# Patient Record
Sex: Female | Born: 1950 | Race: White | Hispanic: No | Marital: Married | Smoking: Never smoker
Health system: Southern US, Community
[De-identification: ages and names within clinical notes are randomized; demographics above are authoritative.]

## PROBLEM LIST (undated history)

## (undated) DIAGNOSIS — I8390 Asymptomatic varicose veins of unspecified lower extremity: Secondary | ICD-10-CM

## (undated) DIAGNOSIS — C801 Malignant (primary) neoplasm, unspecified: Secondary | ICD-10-CM

## (undated) HISTORY — DX: Asymptomatic varicose veins of unspecified lower extremity: I83.90

## (undated) HISTORY — DX: Malignant (primary) neoplasm, unspecified: C80.1

---

## 1999-10-08 ENCOUNTER — Other Ambulatory Visit: Admission: RE | Admit: 1999-10-08 | Discharge: 1999-10-08 | Payer: Self-pay | Admitting: *Deleted

## 2000-02-20 ENCOUNTER — Ambulatory Visit (HOSPITAL_COMMUNITY): Admission: RE | Admit: 2000-02-20 | Discharge: 2000-02-20 | Payer: Self-pay | Admitting: *Deleted

## 2000-02-20 ENCOUNTER — Encounter: Payer: Self-pay | Admitting: *Deleted

## 2000-07-18 ENCOUNTER — Observation Stay (HOSPITAL_COMMUNITY): Admission: RE | Admit: 2000-07-18 | Discharge: 2000-07-19 | Payer: Self-pay | Admitting: Gastroenterology

## 2000-07-18 ENCOUNTER — Encounter (INDEPENDENT_AMBULATORY_CARE_PROVIDER_SITE_OTHER): Payer: Self-pay | Admitting: Specialist

## 2000-07-18 ENCOUNTER — Encounter: Payer: Self-pay | Admitting: Gastroenterology

## 2000-07-24 ENCOUNTER — Ambulatory Visit (HOSPITAL_COMMUNITY): Admission: RE | Admit: 2000-07-24 | Discharge: 2000-07-24 | Payer: Self-pay | Admitting: Surgery

## 2000-07-24 ENCOUNTER — Encounter: Payer: Self-pay | Admitting: Surgery

## 2000-08-06 ENCOUNTER — Encounter (INDEPENDENT_AMBULATORY_CARE_PROVIDER_SITE_OTHER): Payer: Self-pay | Admitting: Specialist

## 2000-08-06 ENCOUNTER — Inpatient Hospital Stay (HOSPITAL_COMMUNITY): Admission: RE | Admit: 2000-08-06 | Discharge: 2000-08-21 | Payer: Self-pay | Admitting: General Surgery

## 2000-08-06 HISTORY — PX: WHIPPLE PROCEDURE: SHX2667

## 2000-08-06 HISTORY — PX: EXPLORATORY LAPAROTOMY: SUR591

## 2000-08-18 ENCOUNTER — Encounter: Payer: Self-pay | Admitting: General Surgery

## 2000-08-20 ENCOUNTER — Encounter: Payer: Self-pay | Admitting: General Surgery

## 2000-09-10 ENCOUNTER — Encounter: Admission: RE | Admit: 2000-09-10 | Discharge: 2000-12-09 | Payer: Self-pay | Admitting: Radiation Oncology

## 2000-09-16 ENCOUNTER — Encounter: Payer: Self-pay | Admitting: General Surgery

## 2000-09-16 ENCOUNTER — Ambulatory Visit (HOSPITAL_COMMUNITY): Admission: RE | Admit: 2000-09-16 | Discharge: 2000-09-16 | Payer: Self-pay | Admitting: General Surgery

## 2000-10-08 ENCOUNTER — Other Ambulatory Visit: Admission: RE | Admit: 2000-10-08 | Discharge: 2000-10-08 | Payer: Self-pay | Admitting: *Deleted

## 2001-01-02 ENCOUNTER — Encounter: Payer: Self-pay | Admitting: Hematology & Oncology

## 2001-01-02 ENCOUNTER — Encounter: Admission: RE | Admit: 2001-01-02 | Discharge: 2001-01-02 | Payer: Self-pay | Admitting: Hematology & Oncology

## 2001-01-22 ENCOUNTER — Encounter: Payer: Self-pay | Admitting: General Surgery

## 2001-01-22 ENCOUNTER — Ambulatory Visit (HOSPITAL_COMMUNITY): Admission: RE | Admit: 2001-01-22 | Discharge: 2001-01-22 | Payer: Self-pay | Admitting: General Surgery

## 2001-01-23 ENCOUNTER — Encounter (HOSPITAL_COMMUNITY): Admission: RE | Admit: 2001-01-23 | Discharge: 2001-04-23 | Payer: Self-pay | Admitting: Hematology & Oncology

## 2001-01-26 ENCOUNTER — Encounter: Payer: Self-pay | Admitting: General Surgery

## 2001-01-26 ENCOUNTER — Ambulatory Visit (HOSPITAL_COMMUNITY): Admission: RE | Admit: 2001-01-26 | Discharge: 2001-01-26 | Payer: Self-pay | Admitting: General Surgery

## 2001-04-10 ENCOUNTER — Emergency Department (HOSPITAL_COMMUNITY): Admission: EM | Admit: 2001-04-10 | Discharge: 2001-04-11 | Payer: Self-pay | Admitting: Emergency Medicine

## 2001-05-21 ENCOUNTER — Ambulatory Visit (HOSPITAL_BASED_OUTPATIENT_CLINIC_OR_DEPARTMENT_OTHER): Admission: RE | Admit: 2001-05-21 | Discharge: 2001-05-21 | Payer: Self-pay | Admitting: General Surgery

## 2001-08-19 ENCOUNTER — Encounter: Payer: Self-pay | Admitting: *Deleted

## 2001-08-19 ENCOUNTER — Ambulatory Visit (HOSPITAL_COMMUNITY): Admission: RE | Admit: 2001-08-19 | Discharge: 2001-08-19 | Payer: Self-pay | Admitting: *Deleted

## 2002-02-23 ENCOUNTER — Encounter: Admission: RE | Admit: 2002-02-23 | Discharge: 2002-04-01 | Payer: Self-pay | Admitting: General Surgery

## 2002-10-21 ENCOUNTER — Other Ambulatory Visit: Admission: RE | Admit: 2002-10-21 | Discharge: 2002-10-21 | Payer: Self-pay | Admitting: *Deleted

## 2003-08-05 ENCOUNTER — Ambulatory Visit (HOSPITAL_COMMUNITY): Admission: RE | Admit: 2003-08-05 | Discharge: 2003-08-05 | Payer: Self-pay | Admitting: *Deleted

## 2003-08-05 ENCOUNTER — Encounter: Payer: Self-pay | Admitting: *Deleted

## 2003-10-26 ENCOUNTER — Other Ambulatory Visit: Admission: RE | Admit: 2003-10-26 | Discharge: 2003-10-26 | Payer: Self-pay | Admitting: *Deleted

## 2004-09-26 ENCOUNTER — Ambulatory Visit (HOSPITAL_COMMUNITY): Admission: RE | Admit: 2004-09-26 | Discharge: 2004-09-26 | Payer: Self-pay | Admitting: *Deleted

## 2004-10-30 ENCOUNTER — Other Ambulatory Visit: Admission: RE | Admit: 2004-10-30 | Discharge: 2004-10-30 | Payer: Self-pay | Admitting: *Deleted

## 2005-01-21 ENCOUNTER — Ambulatory Visit: Payer: Self-pay | Admitting: Hematology & Oncology

## 2005-07-19 ENCOUNTER — Ambulatory Visit: Payer: Self-pay | Admitting: Hematology & Oncology

## 2005-09-24 ENCOUNTER — Encounter: Admission: RE | Admit: 2005-09-24 | Discharge: 2005-09-24 | Payer: Self-pay | Admitting: Dermatology

## 2005-10-28 ENCOUNTER — Ambulatory Visit (HOSPITAL_COMMUNITY): Admission: RE | Admit: 2005-10-28 | Discharge: 2005-10-28 | Payer: Self-pay | Admitting: Obstetrics and Gynecology

## 2005-11-13 ENCOUNTER — Other Ambulatory Visit: Admission: RE | Admit: 2005-11-13 | Discharge: 2005-11-13 | Payer: Self-pay | Admitting: Obstetrics and Gynecology

## 2006-01-17 ENCOUNTER — Ambulatory Visit: Payer: Self-pay | Admitting: Hematology & Oncology

## 2006-07-14 ENCOUNTER — Ambulatory Visit: Payer: Self-pay | Admitting: Hematology & Oncology

## 2006-07-16 LAB — CBC WITH DIFFERENTIAL/PLATELET
Basophils Absolute: 0 10*3/uL (ref 0.0–0.1)
Eosinophils Absolute: 0.1 10*3/uL (ref 0.0–0.5)
LYMPH%: 18.4 % (ref 14.0–48.0)
MCH: 29.9 pg (ref 26.0–34.0)
MCV: 89.2 fL (ref 81.0–101.0)
MONO%: 6.8 % (ref 0.0–13.0)
NEUT#: 4.4 10*3/uL (ref 1.5–6.5)
Platelets: 223 10*3/uL (ref 145–400)
RBC: 4.06 10*6/uL (ref 3.70–5.32)

## 2006-07-16 LAB — CANCER ANTIGEN 19-9: CA 19-9: 10.7 U/mL (ref <35.0)

## 2006-07-16 LAB — COMPREHENSIVE METABOLIC PANEL
BUN: 23 mg/dL (ref 6–23)
CO2: 30 mEq/L (ref 19–32)
Calcium: 9.3 mg/dL (ref 8.4–10.5)
Chloride: 103 mEq/L (ref 96–112)
Creatinine, Ser: 0.86 mg/dL (ref 0.40–1.20)
Glucose, Bld: 143 mg/dL — ABNORMAL HIGH (ref 70–99)

## 2006-10-03 ENCOUNTER — Encounter: Admission: RE | Admit: 2006-10-03 | Discharge: 2006-10-03 | Payer: Self-pay | Admitting: Gastroenterology

## 2006-12-12 ENCOUNTER — Other Ambulatory Visit: Admission: RE | Admit: 2006-12-12 | Discharge: 2006-12-12 | Payer: Self-pay | Admitting: Obstetrics and Gynecology

## 2007-01-14 ENCOUNTER — Ambulatory Visit: Payer: Self-pay | Admitting: Hematology & Oncology

## 2007-01-16 LAB — COMPREHENSIVE METABOLIC PANEL
ALT: 16 U/L (ref 0–35)
CO2: 31 mEq/L (ref 19–32)
Calcium: 9.3 mg/dL (ref 8.4–10.5)
Chloride: 104 mEq/L (ref 96–112)
Sodium: 141 mEq/L (ref 135–145)
Total Protein: 6.3 g/dL (ref 6.0–8.3)

## 2007-01-16 LAB — CBC WITH DIFFERENTIAL/PLATELET
Basophils Absolute: 0 10*3/uL (ref 0.0–0.1)
EOS%: 1.3 % (ref 0.0–7.0)
HGB: 11.7 g/dL (ref 11.6–15.9)
LYMPH%: 21.2 % (ref 14.0–48.0)
MCH: 30.3 pg (ref 26.0–34.0)
MCV: 89.1 fL (ref 81.0–101.0)
MONO%: 8.6 % (ref 0.0–13.0)
RBC: 3.85 10*6/uL (ref 3.70–5.32)
RDW: 14.6 % — ABNORMAL HIGH (ref 11.3–14.5)

## 2007-01-16 LAB — LACTATE DEHYDROGENASE: LDH: 125 U/L (ref 94–250)

## 2007-01-16 IMAGING — US US EXTREM LOW VENOUS BILAT
1 series · 14 of 24 positions shown · non-contrast
Comparison: none

CLINICAL DATA: Varicose veins
 BILATERAL LOWER EXTREMITY VENOUS ULTRASOUND:
 The patient underwent gray scale, color, and Doppler imaging of the lower extremity venous systems bilaterally.
 Right Lower Extremity Venous Ultrasound: 
 The right great saphenous vein and small saphenous vein are both competent without evidence of reflux or thrombosis.  The deep venous system is widely patent with normal compressibility and augmentation.  No DVT.  
 Left Lower Extremity Venous Ultrasound:
 The left great saphenous vein is competent throughout the majority of its course.  There is a very short segment that refluxes in the distal calf, but this is not felt to be clinically significant or related to the patient?s symptoms.  The left small saphenous vein is competent. 
 The left deep venous system is widely patent with normal compressibility and augmentation.  No DVT.

[Series 1: unknown · 14 of 41 slices shown]
[im 1/41]
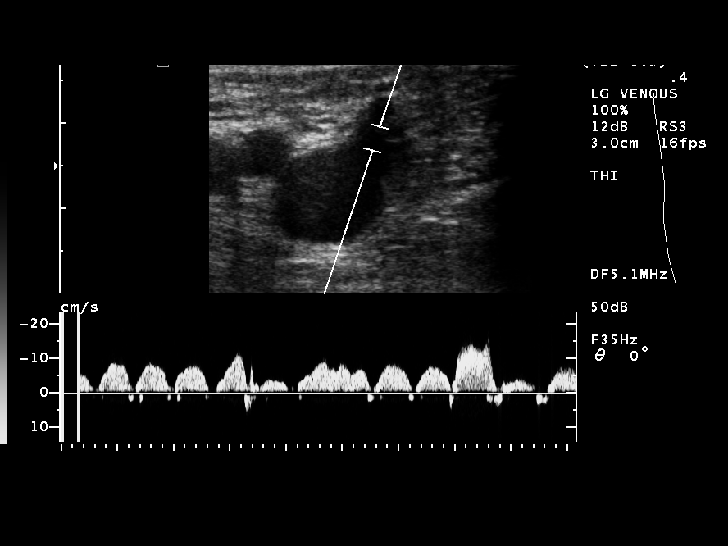
[im 4/41]
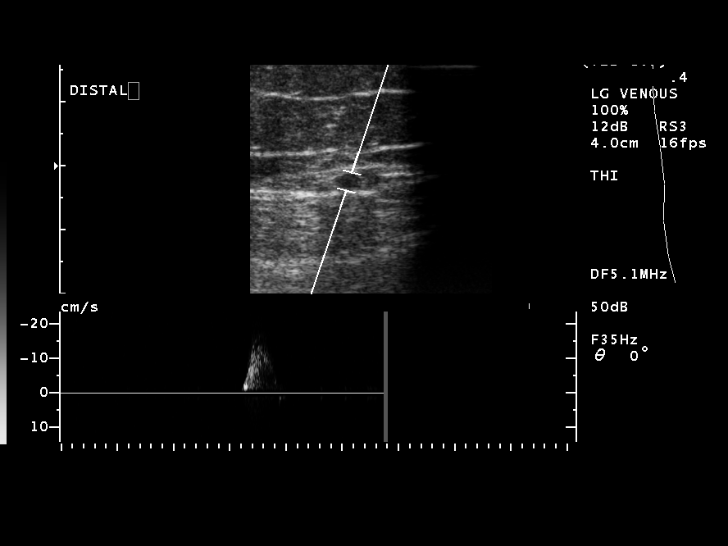
[im 7/41]
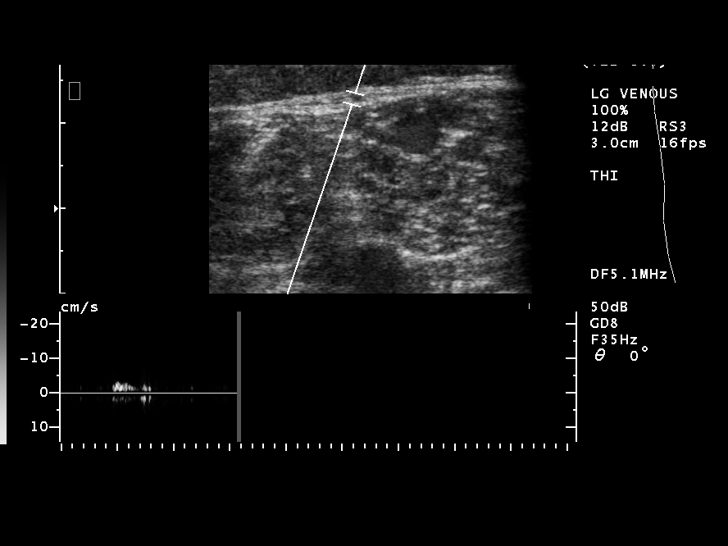
[im 11/41]
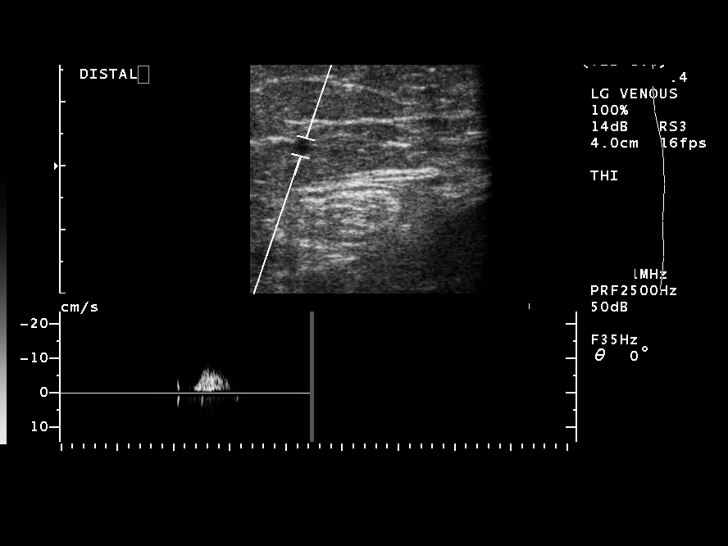
[im 13/41]
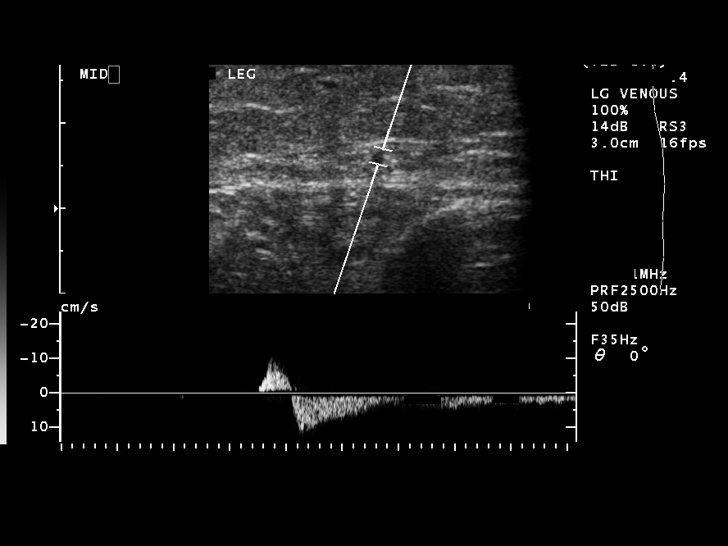
[im 16/41]
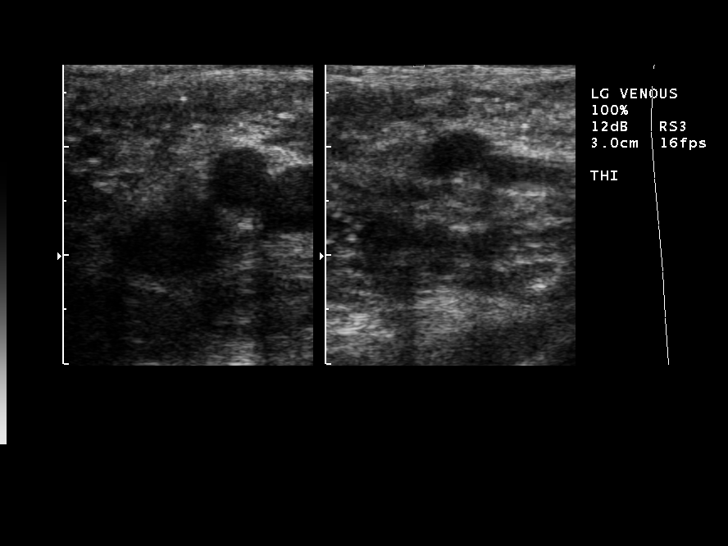
[im 20/41]
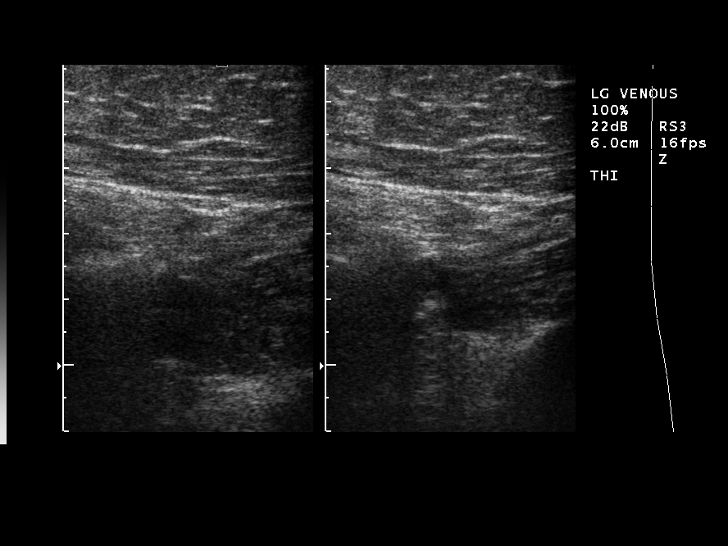
[im 21/41]
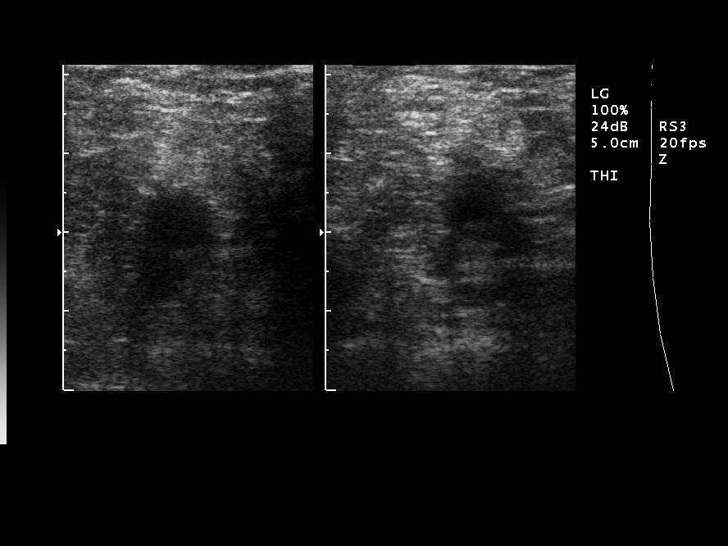
[im 25/41]
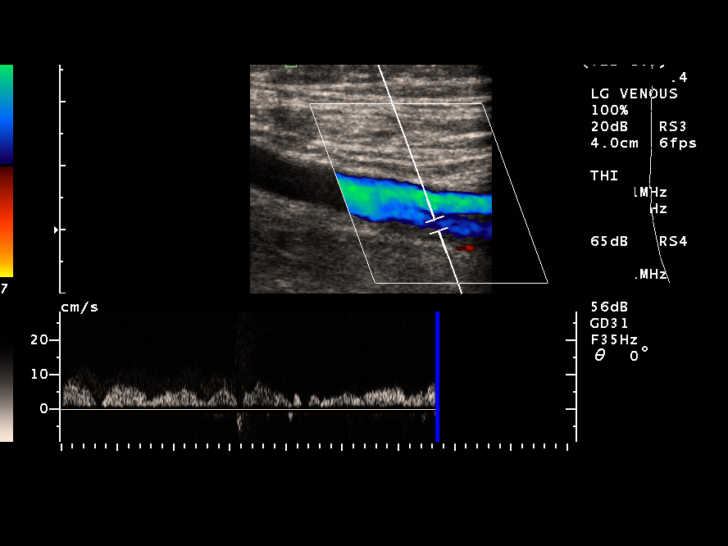
[im 28/41]
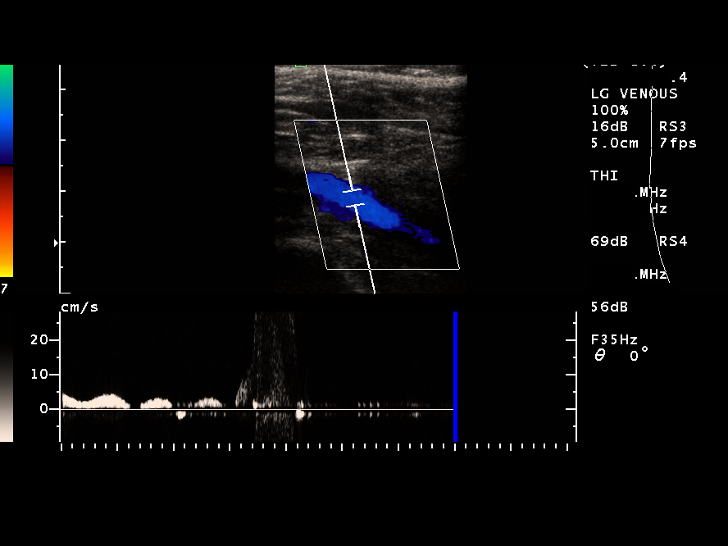
[im 32/41]
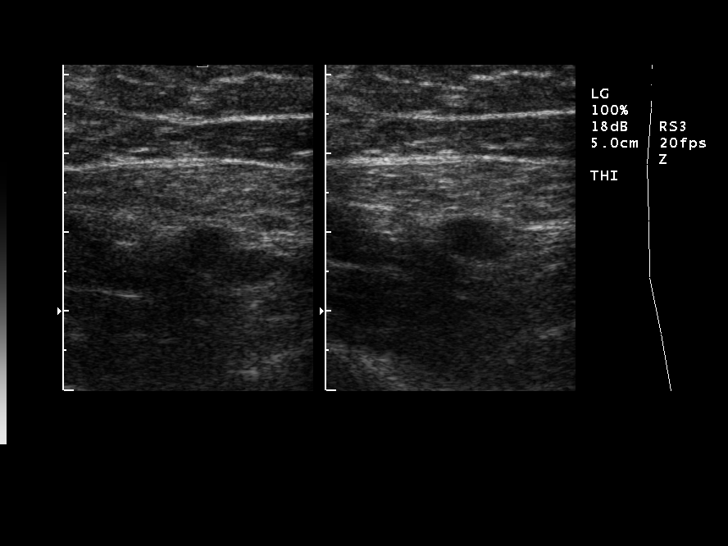
[im 34/41]
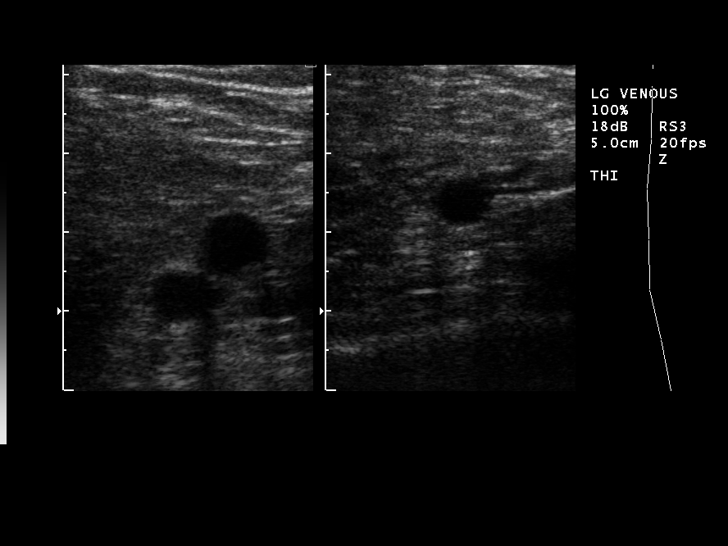
[im 37/41]
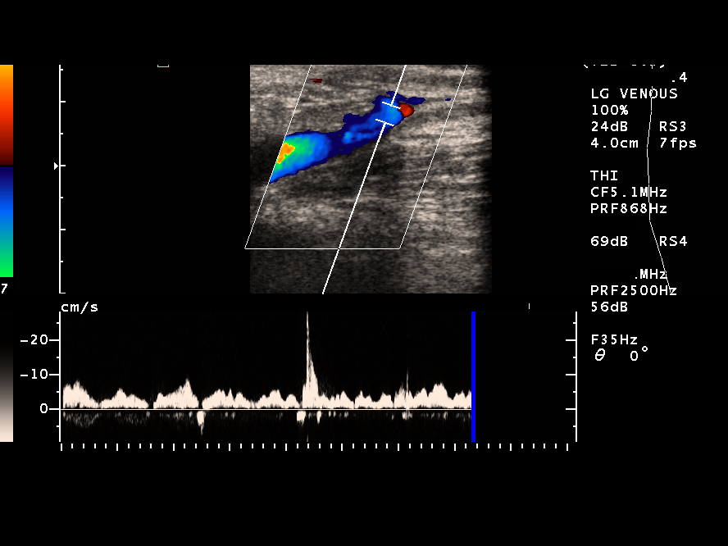
[im 41/41]
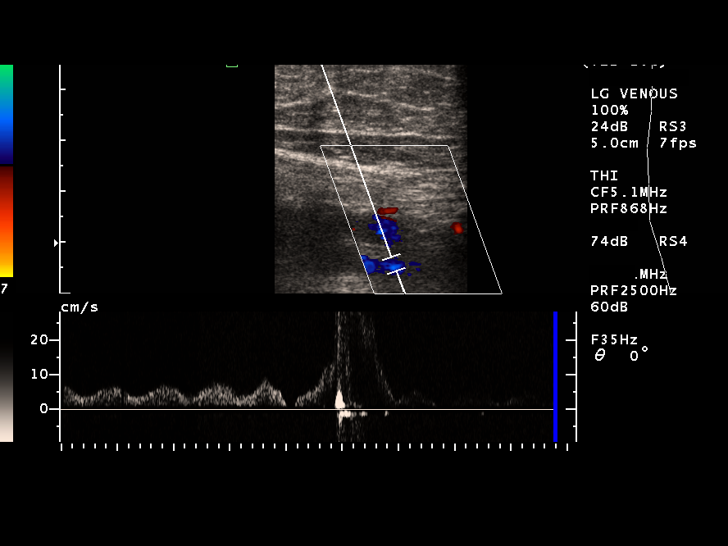

[14 of 24 positions shown; findings below may reference images not displayed]

IMPRESSION: 1.  No clinically significant truncal reflux within either leg.  
 2.  No evidence of DVT.

## 2007-07-20 ENCOUNTER — Ambulatory Visit: Payer: Self-pay | Admitting: Hematology & Oncology

## 2007-07-22 LAB — COMPREHENSIVE METABOLIC PANEL
BUN: 24 mg/dL — ABNORMAL HIGH (ref 6–23)
CO2: 32 mEq/L (ref 19–32)
Creatinine, Ser: 0.75 mg/dL (ref 0.40–1.20)
Glucose, Bld: 104 mg/dL — ABNORMAL HIGH (ref 70–99)
Total Bilirubin: 0.4 mg/dL (ref 0.3–1.2)

## 2007-07-22 LAB — CBC WITH DIFFERENTIAL/PLATELET
BASO%: 0.4 % (ref 0.0–2.0)
EOS%: 2.7 % (ref 0.0–7.0)
Eosinophils Absolute: 0.1 10*3/uL (ref 0.0–0.5)
MCHC: 34.4 g/dL (ref 32.0–36.0)
MCV: 89.4 fL (ref 81.0–101.0)
MONO%: 7.5 % (ref 0.0–13.0)
NEUT#: 3.8 10*3/uL (ref 1.5–6.5)
RBC: 3.88 10*6/uL (ref 3.70–5.32)
RDW: 14.3 % (ref 11.3–14.5)

## 2007-07-22 LAB — CANCER ANTIGEN 19-9: CA 19-9: 3.3 U/mL (ref <35.0)

## 2008-01-08 ENCOUNTER — Other Ambulatory Visit: Admission: RE | Admit: 2008-01-08 | Discharge: 2008-01-08 | Payer: Self-pay | Admitting: Obstetrics and Gynecology

## 2008-01-25 ENCOUNTER — Ambulatory Visit: Payer: Self-pay | Admitting: Hematology & Oncology

## 2008-01-25 IMAGING — US US ABDOMEN COMPLETE
1 series · 13 of 25 positions shown · non-contrast
Comparison: None.

CLINICAL DATA: Post Whipple for ampullary cancer.  Elevated liver function tests with episode of improving abdominal pain.
ABDOMEN ULTRASOUND:
TECHNIQUE: Complete abdominal ultrasound examination was performed including evaluation of the liver, bile ducts, pancreas, kidneys, spleen, IVC, and abdominal aorta.

[Series 1: us abdomen complete · 0.29mm/px · 13 of 85 slices shown]
[im 1/85]
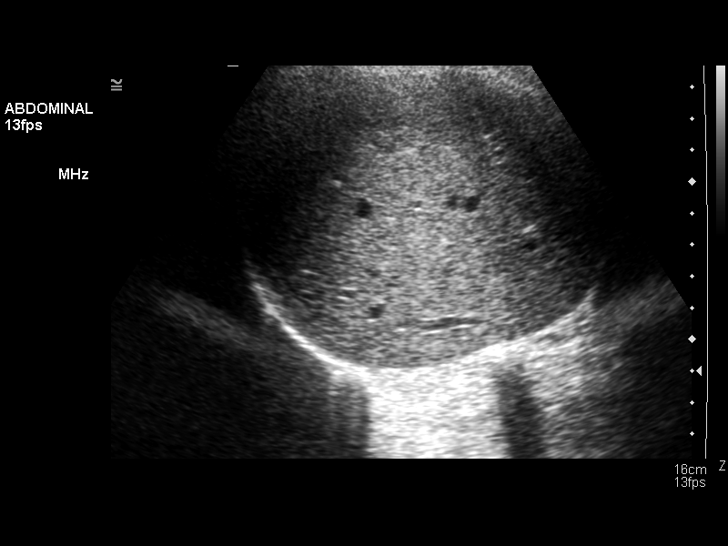
[im 8/85]
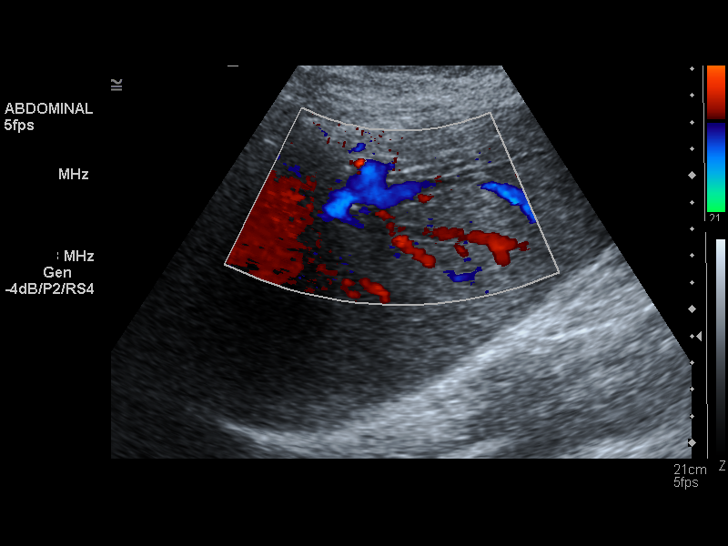
[im 15/85]
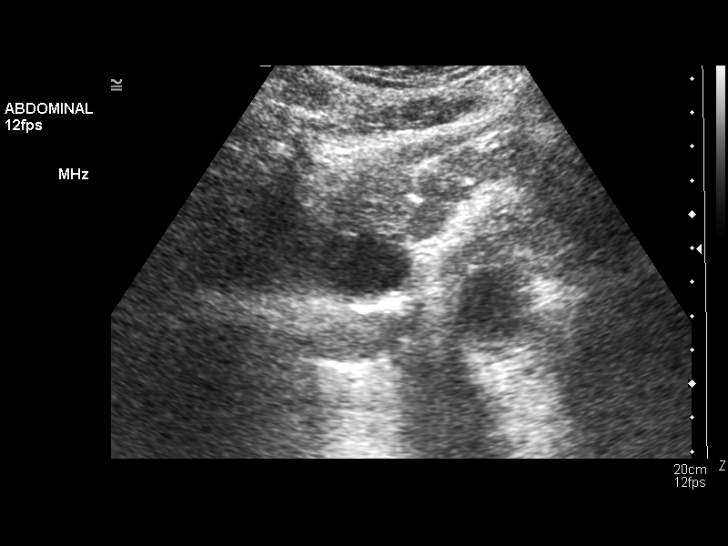
[im 22/85]
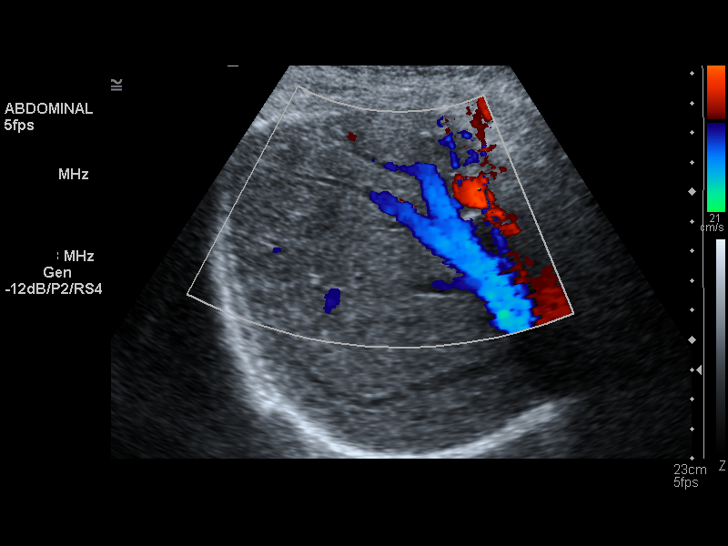
[im 29/85]
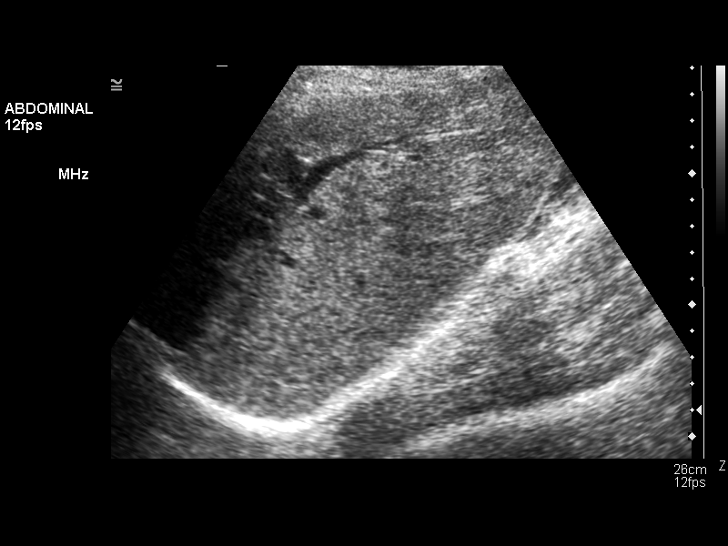
[im 36/85]
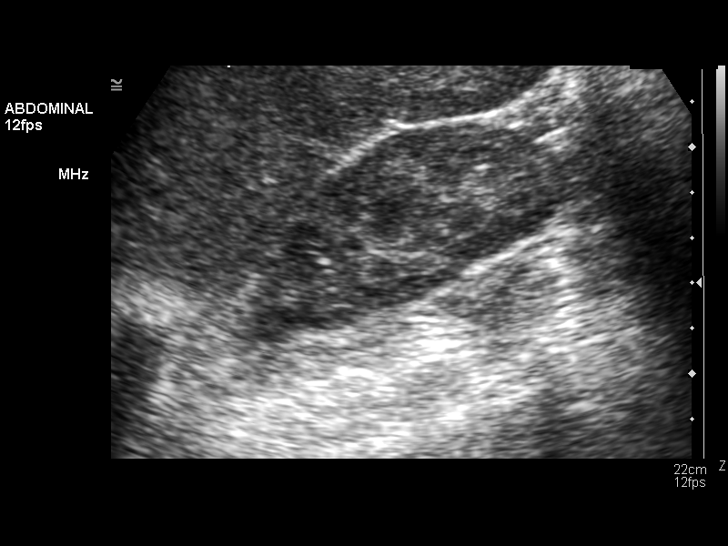
[im 43/85]
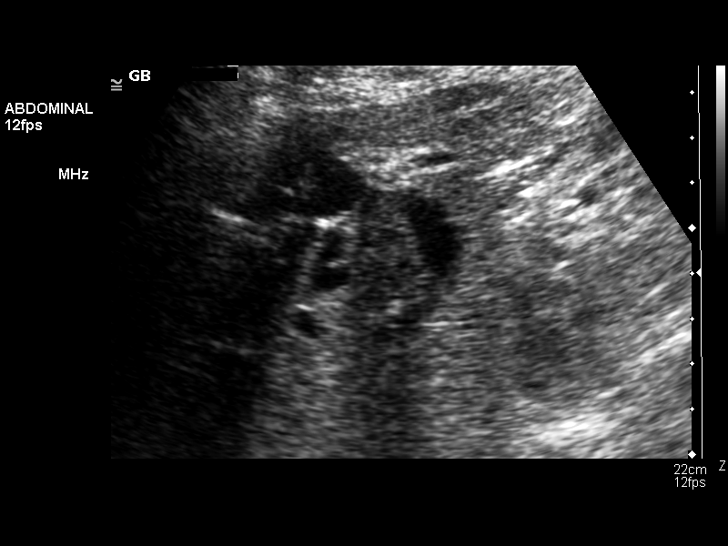
[im 50/85]
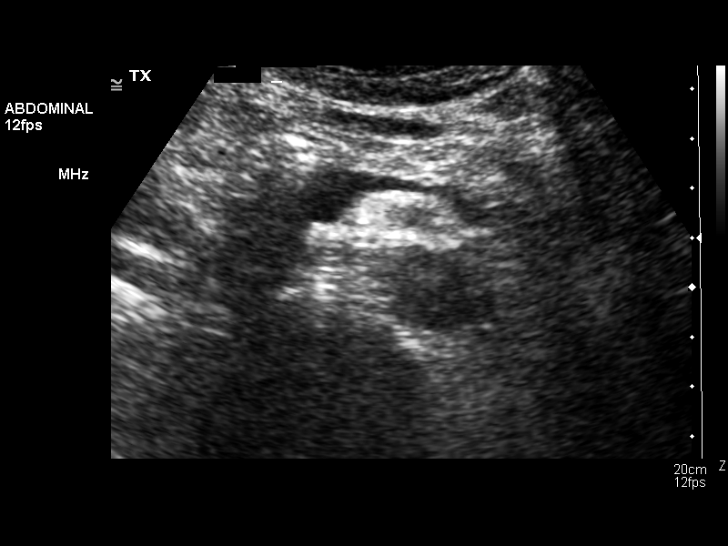
[im 57/85]
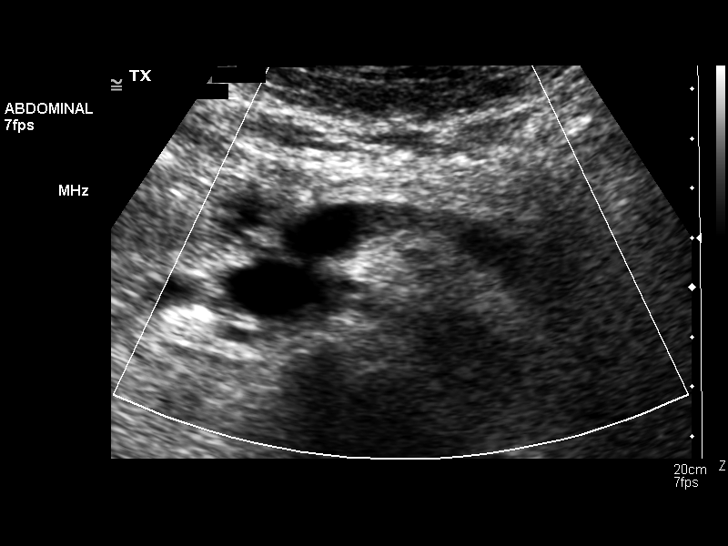
[im 64/85]
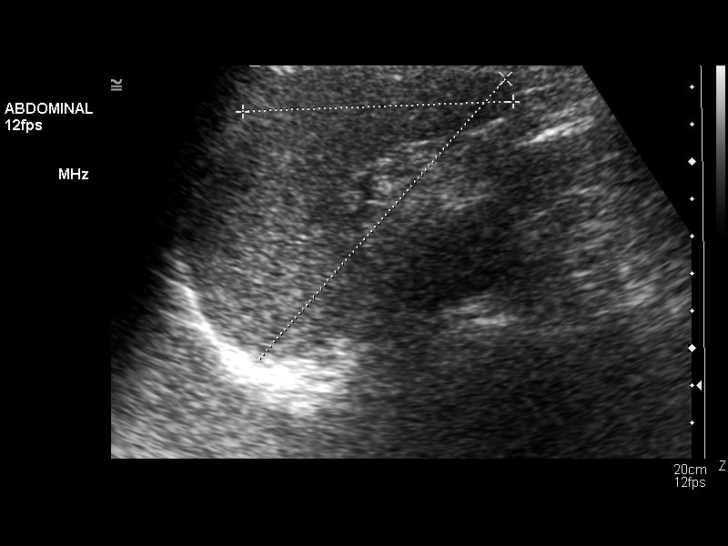
[im 71/85]
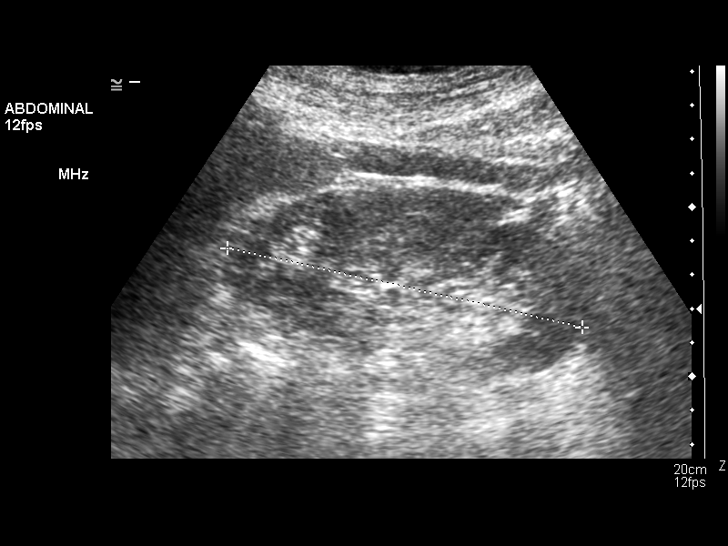
[im 78/85]
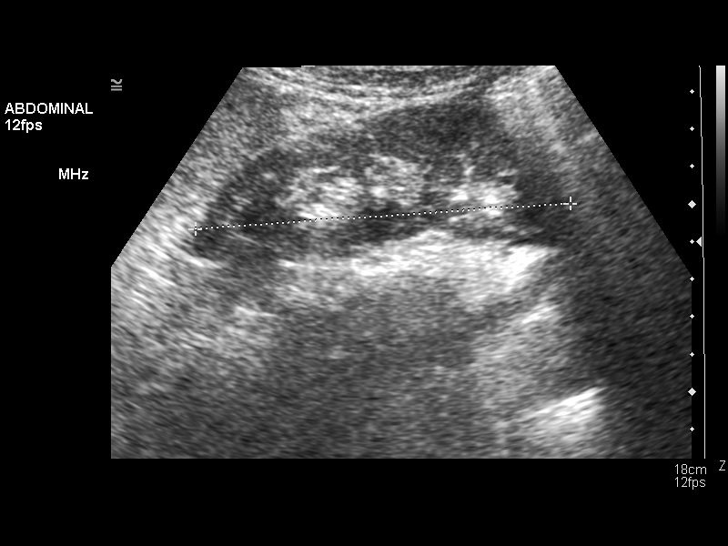
[im 85/85]
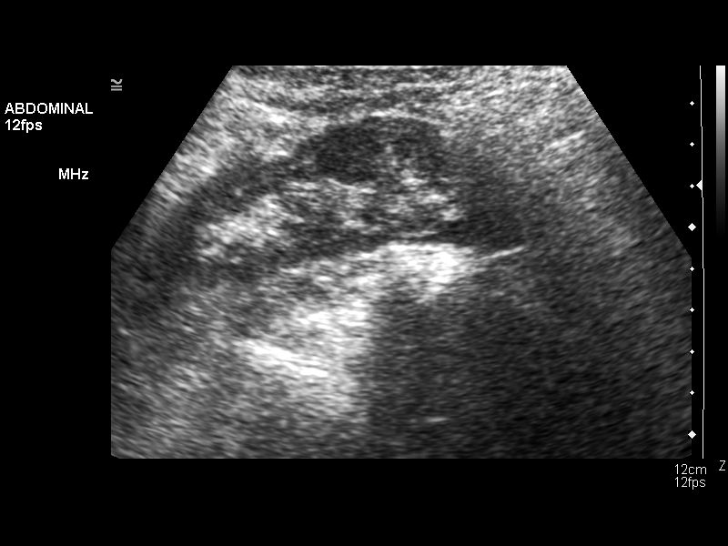

[13 of 25 positions shown; findings below may reference images not displayed]

FINDINGS: Expected postoperative changes of Whipple procedure with absence of the pancreatic head, common bile duct, and post cholecystectomy noted.  There is probable postoperative distortion with prominent, yet normal vascularity at the gallbladder fossa region of the liver.  neumobilia (consistent with prior surgery)  findings are seen at the intrahepatic bile ducts primarily at the left.  Left lobe of liver is slightly limited for assessment due to abdominal scarring and overlying gastrointestinal gas.  No metastatic liver lesions are seen with normal liver size.  Remaining pancreatic duct and residual body and tail of pancreas appear sonographically normal.  Proximal abdominal aorta measures 2.1 cm tapering to bifurcation.  Right kidney measures 9.9 cm long with the left kidney measuring 10.7 cm long with no focal renal lesions nor hydronephrosis.  Spleen measures 7.3 x 10.1 cm with no focal lesions.  Visualized inferior vena cava is unremarkable.
IMPRESSION: 1.  Expected postoperative Whipple changes with no dilated intrahepatic nor residual pancreatic ducts.
2.  Postoperative pneumobilia.
3.  Otherwise negative.  No evidence for metastatic disease.

## 2008-01-27 LAB — CBC WITH DIFFERENTIAL/PLATELET
BASO%: 1 % (ref 0.0–2.0)
Basophils Absolute: 0.1 10*3/uL (ref 0.0–0.1)
EOS%: 1.9 % (ref 0.0–7.0)
MCH: 30.6 pg (ref 26.0–34.0)
MCHC: 34.5 g/dL (ref 32.0–36.0)
MCV: 88.7 fL (ref 81.0–101.0)
MONO%: 7.7 % (ref 0.0–13.0)
RBC: 3.82 10*6/uL (ref 3.70–5.32)
RDW: 14.5 % (ref 11.3–14.5)
lymph#: 1 10*3/uL (ref 0.9–3.3)

## 2008-01-27 LAB — COMPREHENSIVE METABOLIC PANEL
ALT: 21 U/L (ref 0–35)
AST: 26 U/L (ref 0–37)
Albumin: 4 g/dL (ref 3.5–5.2)
Alkaline Phosphatase: 87 U/L (ref 39–117)
BUN: 28 mg/dL — ABNORMAL HIGH (ref 6–23)
Potassium: 4.4 mEq/L (ref 3.5–5.3)
Sodium: 143 mEq/L (ref 135–145)

## 2008-07-03 ENCOUNTER — Ambulatory Visit: Payer: Self-pay | Admitting: Hematology & Oncology

## 2008-07-07 LAB — COMPREHENSIVE METABOLIC PANEL
ALT: 19 U/L (ref 0–35)
AST: 23 U/L (ref 0–37)
Chloride: 103 mEq/L (ref 96–112)
Creatinine, Ser: 0.77 mg/dL (ref 0.40–1.20)
Sodium: 140 mEq/L (ref 135–145)
Total Bilirubin: 0.5 mg/dL (ref 0.3–1.2)
Total Protein: 6.4 g/dL (ref 6.0–8.3)

## 2008-07-07 LAB — CBC WITH DIFFERENTIAL/PLATELET
BASO%: 0.5 % (ref 0.0–2.0)
LYMPH%: 19.2 % (ref 14.0–48.0)
MCHC: 34 g/dL (ref 32.0–36.0)
MONO#: 0.3 10*3/uL (ref 0.1–0.9)
MONO%: 6.7 % (ref 0.0–13.0)
NEUT#: 3.2 10*3/uL (ref 1.5–6.5)
Platelets: 206 10*3/uL (ref 145–400)
RBC: 3.73 10*6/uL (ref 3.70–5.32)
RDW: 14.4 % (ref 11.3–14.5)
WBC: 4.6 10*3/uL (ref 3.9–10.0)

## 2008-07-07 LAB — CANCER ANTIGEN 19-9: CA 19-9: 11.1 U/mL (ref <35.0)

## 2008-07-12 ENCOUNTER — Ambulatory Visit: Payer: Self-pay | Admitting: Hematology & Oncology

## 2009-01-09 ENCOUNTER — Ambulatory Visit: Payer: Self-pay | Admitting: Hematology & Oncology

## 2009-01-10 ENCOUNTER — Other Ambulatory Visit: Admission: RE | Admit: 2009-01-10 | Discharge: 2009-01-10 | Payer: Self-pay | Admitting: Obstetrics and Gynecology

## 2009-01-10 LAB — COMPREHENSIVE METABOLIC PANEL
ALT: 20 U/L (ref 0–35)
Albumin: 4 g/dL (ref 3.5–5.2)
CO2: 30 mEq/L (ref 19–32)
Chloride: 105 mEq/L (ref 96–112)
Glucose, Bld: 84 mg/dL (ref 70–99)
Potassium: 4.4 mEq/L (ref 3.5–5.3)
Sodium: 143 mEq/L (ref 135–145)
Total Bilirubin: 0.4 mg/dL (ref 0.3–1.2)
Total Protein: 6.6 g/dL (ref 6.0–8.3)

## 2009-01-10 LAB — CBC WITH DIFFERENTIAL (CANCER CENTER ONLY)
BASO#: 0 10*3/uL (ref 0.0–0.2)
EOS%: 1.9 % (ref 0.0–7.0)
Eosinophils Absolute: 0.1 10*3/uL (ref 0.0–0.5)
HCT: 32 % — ABNORMAL LOW (ref 34.8–46.6)
HGB: 10.8 g/dL — ABNORMAL LOW (ref 11.6–15.9)
LYMPH#: 1.2 10*3/uL (ref 0.9–3.3)
MCH: 29.1 pg (ref 26.0–34.0)
MCHC: 33.6 g/dL (ref 32.0–36.0)
NEUT%: 65.6 % (ref 39.6–80.0)
RBC: 3.69 10*6/uL — ABNORMAL LOW (ref 3.70–5.32)

## 2009-01-10 LAB — CANCER ANTIGEN 19-9: CA 19-9: 15.7 U/mL (ref <35.0)

## 2009-07-17 ENCOUNTER — Ambulatory Visit: Payer: Self-pay | Admitting: Hematology & Oncology

## 2009-07-18 LAB — CBC WITH DIFFERENTIAL (CANCER CENTER ONLY)
BASO#: 0 10*3/uL (ref 0.0–0.2)
BASO%: 0.3 % (ref 0.0–2.0)
HCT: 35.6 % (ref 34.8–46.6)
HGB: 11.8 g/dL (ref 11.6–15.9)
LYMPH#: 1.2 10*3/uL (ref 0.9–3.3)
MONO#: 0.4 10*3/uL (ref 0.1–0.9)
NEUT#: 3.4 10*3/uL (ref 1.5–6.5)
NEUT%: 66.7 % (ref 39.6–80.0)
WBC: 5 10*3/uL (ref 3.9–10.0)

## 2009-07-19 LAB — COMPREHENSIVE METABOLIC PANEL
ALT: 22 U/L (ref 0–35)
CO2: 29 mEq/L (ref 19–32)
Calcium: 9.8 mg/dL (ref 8.4–10.5)
Chloride: 103 mEq/L (ref 96–112)
Creatinine, Ser: 0.74 mg/dL (ref 0.40–1.20)
Glucose, Bld: 74 mg/dL (ref 70–99)
Total Protein: 6.8 g/dL (ref 6.0–8.3)

## 2009-07-19 LAB — CANCER ANTIGEN 19-9: CA 19-9: 18.4 U/mL (ref <35.0)

## 2010-01-11 ENCOUNTER — Ambulatory Visit: Payer: Self-pay | Admitting: Hematology & Oncology

## 2010-01-12 LAB — CBC WITH DIFFERENTIAL (CANCER CENTER ONLY)
BASO%: 0.2 % (ref 0.0–2.0)
EOS%: 3.3 % (ref 0.0–7.0)
HCT: 33.9 % — ABNORMAL LOW (ref 34.8–46.6)
LYMPH%: 25 % (ref 14.0–48.0)
MCH: 29.6 pg (ref 26.0–34.0)
MCHC: 33.9 g/dL (ref 32.0–36.0)
MCV: 87 fL (ref 81–101)
NEUT%: 65 % (ref 39.6–80.0)
RDW: 13.2 % (ref 10.5–14.6)

## 2010-01-12 LAB — COMPREHENSIVE METABOLIC PANEL
AST: 35 U/L (ref 0–37)
Alkaline Phosphatase: 111 U/L (ref 39–117)
BUN: 24 mg/dL — ABNORMAL HIGH (ref 6–23)
Calcium: 9 mg/dL (ref 8.4–10.5)
Chloride: 103 mEq/L (ref 96–112)
Creatinine, Ser: 0.66 mg/dL (ref 0.40–1.20)

## 2010-07-13 ENCOUNTER — Ambulatory Visit: Payer: Self-pay | Admitting: Hematology & Oncology

## 2010-07-13 LAB — COMPREHENSIVE METABOLIC PANEL
AST: 26 U/L (ref 0–37)
Albumin: 4 g/dL (ref 3.5–5.2)
BUN: 23 mg/dL (ref 6–23)
Calcium: 9 mg/dL (ref 8.4–10.5)
Chloride: 103 mEq/L (ref 96–112)
Glucose, Bld: 117 mg/dL — ABNORMAL HIGH (ref 70–99)
Potassium: 4.3 mEq/L (ref 3.5–5.3)
Sodium: 140 mEq/L (ref 135–145)
Total Protein: 6.1 g/dL (ref 6.0–8.3)

## 2010-07-13 LAB — CBC WITH DIFFERENTIAL (CANCER CENTER ONLY)
BASO#: 0 10*3/uL (ref 0.0–0.2)
EOS%: 1.8 % (ref 0.0–7.0)
Eosinophils Absolute: 0.1 10*3/uL (ref 0.0–0.5)
HGB: 11.7 g/dL (ref 11.6–15.9)
LYMPH#: 1.1 10*3/uL (ref 0.9–3.3)
NEUT#: 3.5 10*3/uL (ref 1.5–6.5)
RBC: 3.97 10*6/uL (ref 3.70–5.32)

## 2010-12-29 ENCOUNTER — Encounter: Payer: Self-pay | Admitting: Dermatology

## 2010-12-29 ENCOUNTER — Encounter: Payer: Self-pay | Admitting: Internal Medicine

## 2011-01-07 ENCOUNTER — Ambulatory Visit (HOSPITAL_BASED_OUTPATIENT_CLINIC_OR_DEPARTMENT_OTHER): Payer: 59 | Admitting: Hematology & Oncology

## 2011-01-11 ENCOUNTER — Encounter: Payer: Self-pay | Admitting: Hematology & Oncology

## 2011-01-11 DIAGNOSIS — C241 Malignant neoplasm of ampulla of Vater: Secondary | ICD-10-CM

## 2011-01-11 LAB — CBC WITH DIFFERENTIAL (CANCER CENTER ONLY)
Eosinophils Absolute: 0.1 10*3/uL (ref 0.0–0.5)
HCT: 34.1 % — ABNORMAL LOW (ref 34.8–46.6)
LYMPH%: 22.4 % (ref 14.0–48.0)
MCV: 87 fL (ref 81–101)
MONO#: 0.3 10*3/uL (ref 0.1–0.9)
NEUT%: 68.8 % (ref 39.6–80.0)
RDW: 12.6 % (ref 10.5–14.6)
WBC: 4 10*3/uL (ref 3.9–10.0)

## 2011-01-11 LAB — COMPREHENSIVE METABOLIC PANEL
BUN: 20 mg/dL (ref 6–23)
CO2: 30 mEq/L (ref 19–32)
Creatinine, Ser: 0.76 mg/dL (ref 0.40–1.20)
Glucose, Bld: 106 mg/dL — ABNORMAL HIGH (ref 70–99)
Total Bilirubin: 0.5 mg/dL (ref 0.3–1.2)

## 2011-01-11 LAB — CANCER ANTIGEN 19-9: CA 19-9: 15.6 U/mL (ref <35.0)

## 2011-01-11 LAB — VITAMIN D 25 HYDROXY (VIT D DEFICIENCY, FRACTURES): Vit D, 25-Hydroxy: 46 ng/mL (ref 30–89)

## 2011-01-25 ENCOUNTER — Encounter (HOSPITAL_BASED_OUTPATIENT_CLINIC_OR_DEPARTMENT_OTHER): Payer: 59 | Admitting: Hematology & Oncology

## 2011-01-25 DIAGNOSIS — C241 Malignant neoplasm of ampulla of Vater: Secondary | ICD-10-CM

## 2011-04-26 NOTE — Procedures (Signed)
[facility]  Patient:    Kristine Norton, Kristine Norton                       MRN: [id number] Adm. Date:  [id number] Disc. Date: [id number] Attending:  Louie Bun                           Procedure Report  NO DICTATION. DD:  07/18/00 TD:  07/21/00 Job: [id number]

## 2011-04-26 NOTE — Discharge Summary (Signed)
[facility]  Patient:    Kristine Norton, Kristine Norton                   MRN: [id number] Adm. Date:  [id number] Disc. Date: [id number] Attending:  Brandy Hale CC:         Lennon Alstrom. Felipa Eth, M.D.  John C. Madilyn Fireman, M.D.  Rose Phi. Myna Hidalgo, M.D.   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  OPERATIONS PERFORMED:  Exploratory laparotomy, multiple excisional lymph node biopsies, pylorus preserving Whipple procedure, feeding jejunostomy.  Date of surgery: August 06, 2000.  HISTORY:  This is a 60 year old white female who had some mild abdominal discomfort one year ago and was found to have gallstones, but her symptoms subsided and she had no further problems.  She recently noted upper abdominal discomfort, dark-colored urine, and abdominal bloating and itching.  Workup revealed some elevated liver function tests.  Upper endoscopy and ERCP by Dr. Dorena Cookey was performed recently which showed an obvious mass at the ampulla of Vater.  This was exophytic and projected into the lumen of the duodenum but was around the ampulla.  Cholangiogram showed a relatively normal common bile duct except for its very distal extent which was markedly narrow and had a malignant-appearing shelf.  Biopsies showed fragments involving adenocarcinoma with a focal signet ring cell component.  She was seen in our office, and extensive discussion occurred regarding her diagnosis and possible management. A CT scan was performed, and this showed a 1.5 to 2.0 cm mass in the duodenal wall and/or head of the pancreas but no evidence of metastatic disease or adenopathy.  CEA level was normal.  CA19-9 level was normal.  Mesenteric angiography was performed and was normal.  She had no unusual anatomy.  There was no vessel encasement or tumor blush.  It was recommended that she undergo exploratory laparotomy and  pancreaticoduodenectomy if possible for cure and biliary bypass if metastatic disease was found.  She was brought to the hospital electively for that surgery.  For details of her Past Medical History, Social History, and Family History, please see detailed admission note.  PHYSICAL EXAMINATION:  GENERAL:  A generally healthy-appearing, thin, 60 year old woman in no distress.  HEENT:  Sclerae clear.  Extraocular movements intact.  LUNGS:  Clear.  HEART:  Regular rate and rhythm.  No murmur or arrhythmia.  ABDOMEN:  Soft, no distention.  Well-healed lower midline surgical wound with history of previous C section.  Upper abdomen showed mild tenderness just below the midline but no mass, no guarding.  HOSPITAL COURSE:  On the day of admission, the patient was taken to the operating room and underwent upper abdominal exploration.  We found that she had a small mass around the ampulla of Vater projecting into the duodenum. Grossly, this appeared to more periampullary and did not appear to be in the pancreas tissue itself.  We took lots of lymph node biopsies including celiac nodes and porta hepatis nodes, all of which were negative on frozen section. We found some small lymph nodes behind the superior mesenteric vein adjacent to the pancreas, and one of these had a microscopic focus of metastatic adenocarcinoma.  There was no other evidence of metastatic disease, and so we felt that curative operation was feasible.  We proceeded to do a so called pylorus preserving Whipple procedure.  That was uneventfully.  The reconstruction was done as follows.  First, she had an  end-to-end, mucosa-to-mucosa pancreatojejunostomy.  Second, she had an end-to-side choledochojejunostomy to reconstruct the bilia;ry tree. Third, she had a duodenojejunostomy performed in an end-to-side fashion to reconnect the duodenal bulb to the side of the jejunum.  A feeding jejunostomy was then placed, and the  procedure was completed.  Postoperatively, she actually did fairly well without any major complication.  Final pathology report showed that the margins of resection were clean, but there were three small lymph nodes which had metastatic cancer in them adjacent to the pancreas.  These were R1 lymph nodes.  I discussed the patient report with the patient and her husband.  I advised them to have medical oncology consultation because of the probable need for adjuvant radiation and chemotherapy.  The patient was seen by Dr. Arlan Organ who agreed with these concepts and planned to see her in the office in about two weeks.  Over the next two weeks, the patient slowly but steadily progressed.  She required nasogastric tube for several days because of ileus.  She had Jackson-Pratt drains on the right side of her abdomen which we left in place for a long time, but they never drained much more than serosanguineous fluid. After about two or three days, we started tube feeding slowly and slowly advanced them to the jejunostomy tube.  We were able to get the nasogastric tube out after about one week, and she was able to tolerate some sips of water and liquids.  An upper GI was obtained on September 10 which showed some edema of the anastomosis but no obstruction.  The stomach did empty into the jejunum just fine, and there was no blockage.  She did, however, have intermittent episodes of bloating and vomiting which eventually resolved.  She progressed in her diet slowly, progressed in her activities quite rapidly to where she was ambulating in the hall and was afebrile, had no sign of nay infection.  Her laboratory data on September 12 showed hemoglobin 9.8, white count 9000. Amylase 81. Potassium 4.5, and BUN 9.  Upon discharge, her liver function tests were minimally elevated.  Ultimately, both Jackson-Pratt drains were removed.  She was discharged on  August 21, 2000.  She was doing  better.  She was tolerating clear liquids fairly easily at that point and not having any cramps.  She was tolerating tube feedings for 12 hours at night and wanted to go home.  At that time, her abdomen was soft and benign.  Wounds looked fine.  We arranged for her diet which was clear liquids and advance slowly as tolerated and tube feedings at night.  DISCHARGE MEDICATIONS: 1. Protonix 40 mg daily. 2. Vicodin p.r.n. pain. 3. Multivitamins.  FOLLOWUP:  She was to followup to see me in the office in one week and to follow up with Dr. Arlan Organ in one week.  A Port-A-Cath is planned prior to initiation of chemotherapy. DD:  09/01/00 TD:  09/01/00 Job: [id number]

## 2011-04-26 NOTE — Op Note (Signed)
[facility]  Patient:    Kristine Norton, Kristine Norton                   MRN: [id number] Proc. Date: 09/16/00 Adm. Date:  [id number] Attending:  Brandy Hale CC:         Rose Phi. Myna Hidalgo, M.D.   Operative Report  PREOPERATIVE DIAGNOSIS:  Periampullary carcinoma, status post Whipple procedure.  POSTOPERATIVE DIAGNOSIS:  Periampullary carcinoma, status post Whipple procedure.  OPERATION PERFORMED:  Insertion of LifePort venous vascular access device.  SURGEON:  Angelia Mould. Derrell Lolling, M.D.  OPERATIVE INDICATIONS:  This is a 60 year old white female who presented in August of this year with jaundice, and was found to have adenocarcinoma in the region of the ampulla of Vater.  She underwent a pylorus preserving Whipple procedure and feeding jejunostomy on August 06, 2000.  Her tumor was found to be stage T2, N1.  She has done very well.  Has maintained her weight and is eating normally.  She has seen Dr. Arlan Organ.  Adjuvant radiation therapy and chemotherapy are planned.  Dr. Myna Hidalgo asked me to place a Port-A-Cath, and she is brought to the operating room electively for that procedure.  DESCRIPTION OF PROCEDURE:  The patient was placed supine on the operating table with her arms at her side, and a roll between her shoulders.  She was monitored and sedated by the anesthesia department.  The neck and chest were prepped and draped in a sterile fashion.  One percent Xylocaine with epinephrine was used as a local infiltration anesthetic.  A right internal jugular venipuncture was performed and a guidewire inserted into the superior vena cava under fluoroscopic guidance.  A small incision was made at this site.  A transverse incision was then made below the right clavicle laterally. A subcutaneous pocket was created with sharp dissection and electrocautery. The catheter was drawn through a tunnel between the right neck incision and the infraclavicular  incision.  The catheter was measured and cut in appropriate length and secured to the port with the locking device.  The port was flushed with heparinized saline.  The port was sutured to the pectoralis fascia with four interrupted sutures of 2-0 Prolene.  With the patient in the Trendelenburg position, we inserted the dilator and peel-away sheath over the guidewire into the superior vena cava.  The guidewire and the dilator were removed.  The catheter was inserted through the peel-away sheath and the peel-away sheath removed.  The catheter had excellent blood return and flushed easily.  Fluoroscopy confirmed that the tip of the catheter was at the junction of the superior vena cava and the right atrium. There was no kink or crimping of the catheter anywhere along its course.  The skin incisions were closed in layers using 3-0 Vicryl on the subcutaneous tissue and 4-0 Vicryl subcuticular sutures on the skin.  Benzoin and Steri-Strips were then used.  We then brought to the operating field, an angled Huber needle which was secured to an IV extension tubing and flushed the entire assembly.  The angled Huber needle was inserted through the skin into the port.  We again had excellent blood return and were able to flush this with concentrated heparin solution.  The needle and extension tubing were then secured to the skin with Steri-Strips.  Clean bandages were placed in all the incisions.  The patient was taken to the recovery room.  A chest x-ray will be obtained in the recovery room.  Estimated  blood loss was about 15-20 cc.  Complications none.  Sponge, needle, and instrument counts were correct. DD:  09/16/00 TD:  09/17/00 Job: [id number]

## 2011-07-12 ENCOUNTER — Encounter (HOSPITAL_BASED_OUTPATIENT_CLINIC_OR_DEPARTMENT_OTHER): Payer: 59 | Admitting: Hematology & Oncology

## 2011-07-12 ENCOUNTER — Other Ambulatory Visit: Payer: Self-pay | Admitting: Hematology & Oncology

## 2011-07-12 DIAGNOSIS — C241 Malignant neoplasm of ampulla of Vater: Secondary | ICD-10-CM

## 2011-07-12 LAB — CBC WITH DIFFERENTIAL (CANCER CENTER ONLY)
BASO#: 0 10*3/uL (ref 0.0–0.2)
Eosinophils Absolute: 0.1 10*3/uL (ref 0.0–0.5)
HGB: 11.7 g/dL (ref 11.6–15.9)
MCH: 30 pg (ref 26.0–34.0)
MONO#: 0.4 10*3/uL (ref 0.1–0.9)
MONO%: 7.9 % (ref 0.0–13.0)
NEUT#: 3.4 10*3/uL (ref 1.5–6.5)
RBC: 3.9 10*6/uL (ref 3.70–5.32)
WBC: 4.8 10*3/uL (ref 3.9–10.0)

## 2011-07-12 LAB — COMPREHENSIVE METABOLIC PANEL
ALT: 24 U/L (ref 0–35)
Albumin: 3.9 g/dL (ref 3.5–5.2)
CO2: 30 mEq/L (ref 19–32)
Calcium: 9.4 mg/dL (ref 8.4–10.5)
Chloride: 100 mEq/L (ref 96–112)
Creatinine, Ser: 0.76 mg/dL (ref 0.50–1.10)
Potassium: 4.7 mEq/L (ref 3.5–5.3)
Sodium: 142 mEq/L (ref 135–145)
Total Protein: 6.6 g/dL (ref 6.0–8.3)

## 2011-08-02 ENCOUNTER — Encounter (HOSPITAL_BASED_OUTPATIENT_CLINIC_OR_DEPARTMENT_OTHER): Payer: 59 | Admitting: Hematology & Oncology

## 2011-08-02 DIAGNOSIS — C241 Malignant neoplasm of ampulla of Vater: Secondary | ICD-10-CM

## 2011-08-20 ENCOUNTER — Encounter: Payer: Self-pay | Admitting: Vascular Surgery

## 2011-08-20 ENCOUNTER — Telehealth: Payer: Self-pay | Admitting: *Deleted

## 2011-08-22 NOTE — Telephone Encounter (Signed)
Encounter opened in error.  No actions taken.   Rankin, Neena Rhymes

## 2011-08-28 ENCOUNTER — Encounter: Payer: 59 | Admitting: Vascular Surgery

## 2011-10-01 ENCOUNTER — Encounter: Payer: 59 | Admitting: Vascular Surgery

## 2011-10-01 ENCOUNTER — Other Ambulatory Visit: Payer: 59

## 2011-10-10 ENCOUNTER — Encounter: Payer: 59 | Admitting: Vascular Surgery

## 2011-10-10 ENCOUNTER — Other Ambulatory Visit: Payer: 59

## 2012-02-05 ENCOUNTER — Other Ambulatory Visit (HOSPITAL_BASED_OUTPATIENT_CLINIC_OR_DEPARTMENT_OTHER): Payer: 59 | Admitting: Lab

## 2012-02-05 DIAGNOSIS — C241 Malignant neoplasm of ampulla of Vater: Secondary | ICD-10-CM

## 2012-02-05 LAB — CBC WITH DIFFERENTIAL (CANCER CENTER ONLY)
BASO#: 0 10*3/uL (ref 0.0–0.2)
EOS%: 1.7 % (ref 0.0–7.0)
Eosinophils Absolute: 0.1 10*3/uL (ref 0.0–0.5)
HGB: 11.2 g/dL — ABNORMAL LOW (ref 11.6–15.9)
LYMPH%: 22 % (ref 14.0–48.0)
MCH: 29.4 pg (ref 26.0–34.0)
MCHC: 32.4 g/dL (ref 32.0–36.0)
MCV: 91 fL (ref 81–101)
MONO%: 8.9 % (ref 0.0–13.0)
NEUT#: 3.2 10*3/uL (ref 1.5–6.5)
NEUT%: 67.2 % (ref 39.6–80.0)
RBC: 3.81 10*6/uL (ref 3.70–5.32)

## 2012-02-06 LAB — COMPREHENSIVE METABOLIC PANEL
Alkaline Phosphatase: 94 U/L (ref 39–117)
BUN: 17 mg/dL (ref 6–23)
CO2: 29 mEq/L (ref 19–32)
Creatinine, Ser: 0.71 mg/dL (ref 0.50–1.10)
Glucose, Bld: 78 mg/dL (ref 70–99)
Total Bilirubin: 0.5 mg/dL (ref 0.3–1.2)
Total Protein: 6.5 g/dL (ref 6.0–8.3)

## 2012-02-07 ENCOUNTER — Other Ambulatory Visit: Payer: 59 | Admitting: Lab

## 2012-02-14 ENCOUNTER — Ambulatory Visit (HOSPITAL_BASED_OUTPATIENT_CLINIC_OR_DEPARTMENT_OTHER): Payer: 59 | Admitting: Hematology & Oncology

## 2012-02-14 DIAGNOSIS — Z85841 Personal history of malignant neoplasm of brain: Secondary | ICD-10-CM

## 2012-02-14 DIAGNOSIS — C241 Malignant neoplasm of ampulla of Vater: Secondary | ICD-10-CM

## 2012-02-14 DIAGNOSIS — M810 Age-related osteoporosis without current pathological fracture: Secondary | ICD-10-CM

## 2012-02-14 NOTE — Progress Notes (Signed)
This office note has been dictated.

## 2012-02-14 NOTE — Progress Notes (Signed)
DIAGNOSIS:  Stage III (T1 N2 M0) adenocarcinoma of the ampulla of Vater, remission.  CURRENT THERAPY:  Observation.  HISTORY:  Kristine Norton comes in for her 6 month followup.  She is doing well.  She has really had no complaints since we last saw her.  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  Her husband is going to retire from his job as a Recruitment consultant this summer.  Her kids are doing well.  One now is engaged and likely will get married next year.  She has had no abdominal pain.  She is trying to stay active.  She is eating well.  She has had no problems with nausea or vomiting.  She has had no fever, sweats or chills.  There has been no bleeding.  There has been no headache.  PHYSICAL EXAMINATION:  General:  This is a well-developed, well- nourished white female in no obvious distress.  Vital signs:  97.5, pulse 66, respiratory rate 18, blood pressure 117/72, weight is 145. Head and neck:  Examination shows a normocephalic, atraumatic skull. There are no ocular or oral lesions.  There are no palpable cervical or supraclavicular lymph nodes.  Lungs:  Are clear bilaterally.  Cardiac: Regular rate and rhythm with a normal S1 and S2.  There are no murmurs, rubs or bruits.  Abdomen:  Soft with good bowel sounds.  She has a well- healed laparotomy scar.  There is no fluid wave.  There is no guarding or rebound tenderness.  There is no palpable hepatosplenomegaly.  Back: Exam shows no tenderness over the spine, ribs or hips.  Extremities: Shows no clubbing, cyanosis or edema.  Skin:  No rashes, ecchymoses or petechiae.  LABORATORY STUDIES:  White cell count is 4.8, hemoglobin 11.2, hematocrit 34.6, platelet count 242.  Electrolytes all appear within normal limits.  Liver function tests are normal.  Her CA19-9 is 8.7.  IMPRESSION:  Kristine Norton is a 61 year old white female with a history of stage III adenocarcinoma of the ampulla of Vater.  She now is almost 7 years out from  completion of her treatments.  She did receive adjuvant chemo/radiation therapy, followed by chemotherapy.  Again, I have to believe that she is cured.  I absolutely do not see any evidence of active disease or recurrence.  We will go ahead and plan to have Kristine Norton come back to see [place] in another 6 months.  She enjoys coming to see [place] every 6 months.  We do provide a lot of encouragement and peace of mind for her.    ______________________________ Kristine Norton, M.D. PRE/MEDQ  D:  02/14/2012  T:  02/14/2012  Job:  [id number]

## 2012-04-28 ENCOUNTER — Encounter: Payer: Self-pay | Admitting: *Deleted

## 2012-07-31 ENCOUNTER — Telehealth: Payer: Self-pay | Admitting: Hematology & Oncology

## 2012-07-31 NOTE — Telephone Encounter (Signed)
Patient called and cx 08/21/12 appt and resch for 09/04/12.  She also cx 08/28/12 appt and resch for 09/18/12

## 2012-08-21 ENCOUNTER — Other Ambulatory Visit: Payer: 59 | Admitting: Lab

## 2012-08-28 ENCOUNTER — Ambulatory Visit: Payer: 59 | Admitting: Hematology & Oncology

## 2012-09-04 ENCOUNTER — Other Ambulatory Visit (HOSPITAL_BASED_OUTPATIENT_CLINIC_OR_DEPARTMENT_OTHER): Payer: 59 | Admitting: Lab

## 2012-09-04 ENCOUNTER — Other Ambulatory Visit: Payer: 59 | Admitting: Lab

## 2012-09-04 DIAGNOSIS — M810 Age-related osteoporosis without current pathological fracture: Secondary | ICD-10-CM

## 2012-09-04 DIAGNOSIS — C241 Malignant neoplasm of ampulla of Vater: Secondary | ICD-10-CM

## 2012-09-04 LAB — LACTATE DEHYDROGENASE: LDH: 160 U/L (ref 94–250)

## 2012-09-04 LAB — COMPREHENSIVE METABOLIC PANEL
BUN: 24 mg/dL — ABNORMAL HIGH (ref 6–23)
CO2: 32 mEq/L (ref 19–32)
Calcium: 9.4 mg/dL (ref 8.4–10.5)
Chloride: 102 mEq/L (ref 96–112)
Creatinine, Ser: 0.82 mg/dL (ref 0.50–1.10)
Glucose, Bld: 75 mg/dL (ref 70–99)
Total Bilirubin: 0.5 mg/dL (ref 0.3–1.2)

## 2012-09-04 LAB — CBC WITH DIFFERENTIAL (CANCER CENTER ONLY)
Eosinophils Absolute: 0.1 10*3/uL (ref 0.0–0.5)
HCT: 36.1 % (ref 34.8–46.6)
LYMPH%: 22.8 % (ref 14.0–48.0)
MCH: 29.3 pg (ref 26.0–34.0)
MCV: 90 fL (ref 81–101)
MONO#: 0.5 10*3/uL (ref 0.1–0.9)
MONO%: 9 % (ref 0.0–13.0)
NEUT%: 65.6 % (ref 39.6–80.0)
RBC: 4.03 10*6/uL (ref 3.70–5.32)
WBC: 5.4 10*3/uL (ref 3.9–10.0)

## 2012-09-04 LAB — VITAMIN D 25 HYDROXY (VIT D DEFICIENCY, FRACTURES): Vit D, 25-Hydroxy: 39 ng/mL (ref 30–89)

## 2012-09-07 ENCOUNTER — Telehealth: Payer: Self-pay | Admitting: *Deleted

## 2012-09-07 NOTE — Telephone Encounter (Signed)
Called patient to let her know that her labs all looked great per d.r. Ennever.

## 2012-09-07 NOTE — Telephone Encounter (Signed)
Message copied by Anselm Jungling on Mon Sep 07, 2012 10:38 AM ------      Message from: Arlan Organ R      Created: Mon Sep 07, 2012  7:38 AM       Call - labs all look great!!!  Cindee Lame

## 2012-09-18 ENCOUNTER — Ambulatory Visit (HOSPITAL_BASED_OUTPATIENT_CLINIC_OR_DEPARTMENT_OTHER): Payer: 59 | Admitting: Hematology & Oncology

## 2012-09-18 VITALS — BP 112/69 | HR 66 | Temp 98.4°F | Resp 18 | Ht 65.0 in | Wt 145.0 lb

## 2012-09-18 DIAGNOSIS — C241 Malignant neoplasm of ampulla of Vater: Secondary | ICD-10-CM

## 2012-09-18 DIAGNOSIS — C259 Malignant neoplasm of pancreas, unspecified: Secondary | ICD-10-CM

## 2012-09-18 NOTE — Progress Notes (Signed)
This office note has been dictated.

## 2012-09-18 NOTE — Progress Notes (Signed)
DIAGNOSIS:  Stage III (T1, N2, M0) adenocarcinoma of the ampulla of Vater, remission.  CURRENT THERAPY:  Observation.  INTERIM HISTORY:  Kristine Norton comes in for followup.  She is doing well. She is under a lot of stress at home.  She is taking care of her mother, who is elderly and who has health issues.  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  She is getting ready for her, I think, oldest son to be married next year.  This is going to be May 31.  She has had no problems with pain.  There have been no problems with bowels or bladder.  She has had no cough.  There has been no leg swelling.  Again, she is under a lot of stress because of her mom.  She is certainly staying strong in prayer, as well as we, to get through this difficult time.  PHYSICAL EXAMINATION:  This is a well-developed, well-nourished white female in no obvious distress.  Vital signs:  Temperature of 98.4, pulse 66, respiratory rate 18, blood pressure 112/69.  Weight is 144. Head/neck:  Normocephalic, atraumatic skull.  There is no scleral icterus.  There is no adenopathy.  There are no oral lesions.  Thyroid is nonpalpable.  Lungs:  Clear bilaterally.  Cardiac:  Regular rate and rhythm with a normal S1, S2.  There are no murmurs, rubs or bruits. Abdomen:  Soft with good bowel sounds.  There is no palpable abdominal mass.  There is no palpable hepatosplenomegaly.  She has a well-healed laparotomy scar.  Back:  No tenderness over the spine, ribs, or hips. Extremities:  No clubbing, cyanosis or edema.  Neurological:  No focal neurological deficits.  LABORATORY STUDIES:  A white count of 5.4, hemoglobin 11.8, hematocrit 36.1, platelet count 205. Her CA-19-9 is 11.4.  IMPRESSION:  Kristine Norton is a very nice 61 year old white female with stage III adenocarcinoma of the ampulla of Vater.  This was resected. She underwent adjuvant chemoradiation therapy.  She did well.  Again, I suspect  that she is cured.  We will continue to see her every 6 months.  She enjoys having to come back every 6 months so that we can take a look at her and examine her.    ______________________________ Josph Macho, M.D. PRE/MEDQ  D:  09/18/2012  T:  09/18/2012  Job:  [id number]

## 2013-03-05 ENCOUNTER — Ambulatory Visit (HOSPITAL_BASED_OUTPATIENT_CLINIC_OR_DEPARTMENT_OTHER): Payer: 59 | Admitting: Lab

## 2013-03-05 DIAGNOSIS — C250 Malignant neoplasm of head of pancreas: Secondary | ICD-10-CM

## 2013-03-05 DIAGNOSIS — M810 Age-related osteoporosis without current pathological fracture: Secondary | ICD-10-CM

## 2013-03-05 DIAGNOSIS — C241 Malignant neoplasm of ampulla of Vater: Secondary | ICD-10-CM

## 2013-03-05 LAB — CBC WITH DIFFERENTIAL (CANCER CENTER ONLY)
BASO#: 0 10*3/uL (ref 0.0–0.2)
Eosinophils Absolute: 0.1 10*3/uL (ref 0.0–0.5)
HCT: 35.5 % (ref 34.8–46.6)
LYMPH%: 24.3 % (ref 14.0–48.0)
MCH: 29.4 pg (ref 26.0–34.0)
MCV: 90 fL (ref 81–101)
MONO%: 8.6 % (ref 0.0–13.0)
NEUT%: 65.3 % (ref 39.6–80.0)
Platelets: 191 10*3/uL (ref 145–400)
RBC: 3.95 10*6/uL (ref 3.70–5.32)

## 2013-03-06 LAB — COMPREHENSIVE METABOLIC PANEL
BUN: 31 mg/dL — ABNORMAL HIGH (ref 6–23)
CO2: 36 mEq/L — ABNORMAL HIGH (ref 19–32)
Calcium: 9.4 mg/dL (ref 8.4–10.5)
Creatinine, Ser: 0.79 mg/dL (ref 0.50–1.10)
Glucose, Bld: 81 mg/dL (ref 70–99)
Total Bilirubin: 0.3 mg/dL (ref 0.3–1.2)

## 2013-03-06 LAB — CANCER ANTIGEN 19-9: CA 19-9: 4.1 U/mL (ref <35.0)

## 2013-03-06 LAB — VITAMIN D 25 HYDROXY (VIT D DEFICIENCY, FRACTURES): Vit D, 25-Hydroxy: 19 ng/mL — ABNORMAL LOW (ref 30–89)

## 2013-03-08 ENCOUNTER — Telehealth: Payer: Self-pay | Admitting: *Deleted

## 2013-03-11 ENCOUNTER — Telehealth: Payer: Self-pay | Admitting: *Deleted

## 2013-03-11 DIAGNOSIS — E559 Vitamin D deficiency, unspecified: Secondary | ICD-10-CM

## 2013-03-11 MED ORDER — VITAMIN D 50 MCG (2000 UT) PO TABS: 2000.0000 [IU] | ORAL_TABLET | Freq: Every day | ORAL | Status: AC

## 2013-03-11 NOTE — Telephone Encounter (Addendum)
Message copied by Wynonia Hazard on Thu Mar 11, 2013  3:00 PM ------      Message from: Josph Macho      Created: Sun Mar 07, 2013  3:53 PM       Call - labs are great, except that Vit D is low!!  How much is she on??    Spoke to pt on Monday afternoon. She isn't taking any Vitamin D at this time. Per Dr Myna Hidalgo she should take 2000 iu daily. She verbalized understanding and will start ASAP.

## 2013-03-19 ENCOUNTER — Ambulatory Visit (HOSPITAL_BASED_OUTPATIENT_CLINIC_OR_DEPARTMENT_OTHER): Payer: 59 | Admitting: Hematology & Oncology

## 2013-03-19 VITALS — BP 125/77 | HR 74 | Temp 97.4°F | Resp 16 | Ht 65.0 in | Wt 141.0 lb

## 2013-03-19 DIAGNOSIS — C241 Malignant neoplasm of ampulla of Vater: Secondary | ICD-10-CM

## 2013-03-19 NOTE — Progress Notes (Signed)
This office note has been dictated.

## 2013-03-20 NOTE — Progress Notes (Signed)
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  CURRENT THERAPY:  Observation.  INTERIM HISTORY:  Kristine Norton comes in for followup.  We see her every 6 months.  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  This is quite stressful for her.  She is not sure if she can really keep up with this.  She has had no issues with abdominal pain.  Her appetite has been okay.  She is trying to exercise a little bit more.  Her son is getting married in May.  She is getting ready for this.  She has had no problems with cough or shortness of breath.  She has had no fevers, sweats, or chills.  There have been no rashes.  PHYSICAL EXAMINATION:  General:  This is a well-developed, well- nourished white female in no obvious distress.  Vital signs: Temperature of 97.4, pulse 74, respiratory rate 16, blood pressure 125/77.  Weight is 141.  Head and neck:  Normocephalic, atraumatic skull.  There are no ocular or oral lesions.  There are no palpable cervical or supraclavicular lymph nodes.  Lungs:  Clear bilaterally. Cardiac:  Regular rate and rhythm, with a normal S1, S2.  There are no murmurs, rubs or bruits.  Abdomen:  Soft with good bowel sounds.  She has a well-healed laparotomy scar.  There is no fluid wave.  There is no palpable hepatosplenomegaly.  Back:  No tenderness over the spine, ribs, or hips.  Extremities:  Show no clubbing, cyanosis or edema. Neurological:  Shows no focal neurological deficits.  LABORATORY STUDIES:  White cell count is 6.1, hemoglobin 11.6, hematocrit 35.5, platelet count 191,000.  Her vitamin D is 19.  Her CA19- 9 is 4.1.  IMPRESSION:  Kristine Norton is a 62 year old white female with a history of stage III adenocarcinoma of the ampulla of Vater.  She underwent resection.  She had adjuvant chemo and radiation therapy.  She is pretty much free of disease now for over 10 years.  I still like  following her every 6 months.  Given that she got chemo and radiation therapy, she certainly could be considered at risk for a second malignancy.  As always, we had a good prayer session together.   ______________________________ Josph Macho, M.D. PRE/MEDQ  D:  03/19/2013  T:  03/20/2013  Job:  [id number]

## 2013-08-05 NOTE — Telephone Encounter (Signed)
Erroneous encounter

## 2013-09-10 ENCOUNTER — Other Ambulatory Visit (HOSPITAL_BASED_OUTPATIENT_CLINIC_OR_DEPARTMENT_OTHER): Payer: PRIVATE HEALTH INSURANCE | Admitting: Lab

## 2013-09-10 DIAGNOSIS — C241 Malignant neoplasm of ampulla of Vater: Secondary | ICD-10-CM

## 2013-09-10 LAB — CBC WITH DIFFERENTIAL (CANCER CENTER ONLY)
BASO#: 0 10*3/uL (ref 0.0–0.2)
Eosinophils Absolute: 0.1 10*3/uL (ref 0.0–0.5)
HGB: 11.5 g/dL — ABNORMAL LOW (ref 11.6–15.9)
LYMPH#: 1.2 10*3/uL (ref 0.9–3.3)
MCHC: 32.1 g/dL (ref 32.0–36.0)
NEUT#: 4.3 10*3/uL (ref 1.5–6.5)
Platelets: 210 10*3/uL (ref 145–400)
RBC: 3.93 10*6/uL (ref 3.70–5.32)
RDW: 13.9 % (ref 11.1–15.7)
WBC: 6.1 10*3/uL (ref 3.9–10.0)

## 2013-09-10 LAB — COMPREHENSIVE METABOLIC PANEL
ALT: 32 U/L (ref 0–35)
AST: 31 U/L (ref 0–37)
Alkaline Phosphatase: 105 U/L (ref 39–117)
CO2: 31 mEq/L (ref 19–32)
Chloride: 97 mEq/L (ref 96–112)
Creatinine, Ser: 0.7 mg/dL (ref 0.50–1.10)
Sodium: 131 mEq/L — ABNORMAL LOW (ref 135–145)
Total Bilirubin: 0.4 mg/dL (ref 0.3–1.2)

## 2013-09-10 LAB — CANCER ANTIGEN 19-9: CA 19-9: 10.4 U/mL (ref <35.0)

## 2013-09-15 ENCOUNTER — Telehealth: Payer: Self-pay | Admitting: *Deleted

## 2013-09-15 NOTE — Telephone Encounter (Signed)
Called patient and left mssage on personal answering machine that her labwork all looked good.

## 2013-09-15 NOTE — Telephone Encounter (Signed)
Message copied by Anselm Jungling on Wed Sep 15, 2013  1:14 PM ------      Message from: Josph Macho      Created: Mon Sep 13, 2013  6:35 PM       Call - labs all look good!! Cindee Lame ------

## 2013-09-17 ENCOUNTER — Ambulatory Visit (HOSPITAL_BASED_OUTPATIENT_CLINIC_OR_DEPARTMENT_OTHER): Payer: PRIVATE HEALTH INSURANCE | Admitting: Hematology & Oncology

## 2013-09-17 VITALS — BP 119/62 | HR 73 | Temp 97.7°F | Resp 14 | Ht 65.0 in | Wt 140.0 lb

## 2013-09-17 DIAGNOSIS — M810 Age-related osteoporosis without current pathological fracture: Secondary | ICD-10-CM

## 2013-09-17 DIAGNOSIS — C241 Malignant neoplasm of ampulla of Vater: Secondary | ICD-10-CM

## 2013-09-17 NOTE — Progress Notes (Signed)
This office note has been dictated.

## 2013-09-18 NOTE — Progress Notes (Signed)
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  CURRENT THERAPY:  Observation.  INTERIM HISTORY:  Ms. Friedhoff comes in for her followup.  She is doing fairly well.  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  This has been a little bit stressful for her.  Thankfully, her husband has retired so he can help out a little bit.  He is actually going on a mission trip down in [place] next week.  She has been in good health.  It has been 13 years now since she had the pancreatic cancer.  She has had no abdominal pain.  There has been no problem with bowels or bladder.  She has had no cough or shortness of breath.  There is no been no leg swelling.  She has had no weakness in the legs. She has had no rashes.  Overall, her performance status is ECOG 0.  PHYSICAL EXAMINATION:  General:  This is a well-developed, well- nourished white female in no obvious distress.  Vital signs: Temperature of 97.7, pulse 73, respiratory rate 18, blood pressure is 119/62.  Weight is 140 pounds.  Head and neck:  Normocephalic, atraumatic skull.  There are no ocular or oral lesions.  There are no palpable cervical or supraclavicular lymph nodes.  Lungs:  Clear bilaterally.  Cardiac:  Regular rate and rhythm with a normal S1 and S2. There are no murmurs, rubs or bruits.  Abdomen:  Soft.  She has good bowel sounds.  She has a well-healed laparotomy scar.  There is no fluid wave.  There is no palpable hepatosplenomegaly.  Back:  No tenderness over the spine, ribs, or hips.  Extremities:  No clubbing, cyanosis or edema.  Neurologic:  No focal neurological deficits.  LABORATORY STUDIES:  White cell count is 6.1, hemoglobin 11.5, hematocrit 35.8, platelet count 210,000.  Her CA19-9 is 10.4.  Electrolytes are normal.  Her liver function tests are normal.  IMPRESSION:  Ms. Shortridge is a very charming 62 year old white female with stage III  adenocarcinoma of the ampulla of water.  She underwent a surgical resection.  She then had adjuvant chemotherapy and radiation therapy.  She is really doing well.  I really do not believe that there is going to be any risk of recurrence for her at this point time.  We will __________ see her back every 6 months.  We will continue her on the 63-month followup.    ______________________________ Josph Macho, M.D. PRE/MEDQ  D:  09/17/2013  T:  09/18/2013  Job:  [id number]

## 2013-09-23 ENCOUNTER — Telehealth: Payer: Self-pay | Admitting: Hematology & Oncology

## 2013-09-23 NOTE — Telephone Encounter (Signed)
Patient called and sch 6 month follow up apt in April and lab apt 03/04/13

## 2014-03-04 ENCOUNTER — Telehealth: Payer: Self-pay | Admitting: Hematology & Oncology

## 2014-03-04 ENCOUNTER — Other Ambulatory Visit: Payer: PRIVATE HEALTH INSURANCE | Admitting: Lab

## 2014-03-04 NOTE — Telephone Encounter (Signed)
Patient called and cx 03/04/14 apt and resch for 03/07/14

## 2014-03-07 ENCOUNTER — Other Ambulatory Visit: Payer: PRIVATE HEALTH INSURANCE | Admitting: Lab

## 2014-03-07 ENCOUNTER — Other Ambulatory Visit (HOSPITAL_BASED_OUTPATIENT_CLINIC_OR_DEPARTMENT_OTHER): Payer: PRIVATE HEALTH INSURANCE | Admitting: Lab

## 2014-03-07 DIAGNOSIS — C241 Malignant neoplasm of ampulla of Vater: Secondary | ICD-10-CM

## 2014-03-07 DIAGNOSIS — M810 Age-related osteoporosis without current pathological fracture: Secondary | ICD-10-CM

## 2014-03-07 LAB — CBC WITH DIFFERENTIAL (CANCER CENTER ONLY)
BASO#: 0 10*3/uL (ref 0.0–0.2)
BASO%: 0.4 % (ref 0.0–2.0)
EOS ABS: 0.1 10*3/uL (ref 0.0–0.5)
EOS%: 1.1 % (ref 0.0–7.0)
HCT: 36 % (ref 34.8–46.6)
HEMOGLOBIN: 11.6 g/dL (ref 11.6–15.9)
LYMPH#: 1.3 10*3/uL (ref 0.9–3.3)
LYMPH%: 23.5 % (ref 14.0–48.0)
MCH: 29.4 pg (ref 26.0–34.0)
MCHC: 32.2 g/dL (ref 32.0–36.0)
MCV: 91 fL (ref 81–101)
MONO#: 0.5 10*3/uL (ref 0.1–0.9)
MONO%: 8 % (ref 0.0–13.0)
NEUT#: 3.8 10*3/uL (ref 1.5–6.5)
NEUT%: 67 % (ref 39.6–80.0)
PLATELETS: 216 10*3/uL (ref 145–400)
RBC: 3.94 10*6/uL (ref 3.70–5.32)
RDW: 13.8 % (ref 11.1–15.7)
WBC: 5.7 10*3/uL (ref 3.9–10.0)

## 2014-03-08 ENCOUNTER — Telehealth: Payer: Self-pay

## 2014-03-08 LAB — COMPREHENSIVE METABOLIC PANEL
ALK PHOS: 90 U/L (ref 39–117)
ALT: 28 U/L (ref 0–35)
AST: 28 U/L (ref 0–37)
Albumin: 3.7 g/dL (ref 3.5–5.2)
BUN: 21 mg/dL (ref 6–23)
CALCIUM: 9.2 mg/dL (ref 8.4–10.5)
CHLORIDE: 103 meq/L (ref 96–112)
CO2: 31 mEq/L (ref 19–32)
Creatinine, Ser: 0.7 mg/dL (ref 0.50–1.10)
GLUCOSE: 78 mg/dL (ref 70–99)
POTASSIUM: 4 meq/L (ref 3.5–5.3)
SODIUM: 140 meq/L (ref 135–145)
TOTAL PROTEIN: 6.1 g/dL (ref 6.0–8.3)
Total Bilirubin: 0.5 mg/dL (ref 0.2–1.2)

## 2014-03-08 LAB — CANCER ANTIGEN 19-9: CA 19-9: 12.7 U/mL (ref <35.0)

## 2014-03-08 LAB — VITAMIN D 25 HYDROXY (VIT D DEFICIENCY, FRACTURES): Vit D, 25-Hydroxy: 34 ng/mL (ref 30–89)

## 2014-03-08 NOTE — Telephone Encounter (Signed)
Patient notified of lab results per Dr Ginette Pitman - "Ca 19.9 still stable."  Routed to Dr Buccini per pt request. dph

## 2014-03-25 ENCOUNTER — Encounter: Payer: Self-pay | Admitting: Hematology & Oncology

## 2014-03-25 ENCOUNTER — Ambulatory Visit (HOSPITAL_BASED_OUTPATIENT_CLINIC_OR_DEPARTMENT_OTHER): Payer: PRIVATE HEALTH INSURANCE | Admitting: Hematology & Oncology

## 2014-03-25 VITALS — BP 125/60 | HR 74 | Temp 98.1°F | Resp 16 | Ht 65.0 in | Wt 140.0 lb

## 2014-03-25 DIAGNOSIS — Z923 Personal history of irradiation: Secondary | ICD-10-CM

## 2014-03-25 DIAGNOSIS — C241 Malignant neoplasm of ampulla of Vater: Secondary | ICD-10-CM

## 2014-03-27 NOTE — Progress Notes (Signed)
Hematology and Oncology Follow Up Visit  Kristine Norton [id number] Mar 13, 1951 63 y.o. 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   Current Therapy:    Observation     Interim History:  Ms.  Norton is back for her six-month followup appears to be doing okay. 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  She's had no new health issues. She has no bowel pain. She is eating well. She's having no nausea vomiting. She's had no fever sweats or chills. There's been no problems with cough. She's had no leg swelling. There's been no rashes. Patient didn't get her mammograms routinely.  We check her CEA 19-9. Last level was 12.7.  Medications: Current outpatient prescriptions:calcium carbonate (OS-CAL) 600 MG TABS, Take 600 mg by mouth daily., Disp: , Rfl: ;  cholecalciferol 2000 UNITS tablet, Take 1 tablet (2,000 Units total) by mouth daily., Disp: 30 tablet, Rfl: ;  lipase/protease/amylase (CREON-12/PANCREASE) 12000 UNITS CPEP capsule, Take 2 capsules by mouth., Disp: , Rfl: ;  Multiple Vitamins-Minerals (MULTIVITAMIN WITH MINERALS) tablet, Take 1 tablet by mouth daily., Disp: , Rfl:  omeprazole-sodium bicarbonate (ZEGERID) 40-1100 MG per capsule, Take 1 capsule by mouth daily before breakfast. , Disp: , Rfl: ;  Probiotic Product (ALIGN PO), Take 1 capsule by mouth., Disp: , Rfl: ;  temazepam (RESTORIL) 15 MG capsule, Take 30 mg by mouth at bedtime as needed. , Disp: , Rfl:   Allergies:  Allergies  Allergen Reactions  . Adhesive [Tape]     Rash    Past Medical History, Surgical history, Social history, and Family History were reviewed and updated.  Review of Systems: As above  Physical Exam:  height is 5\' 5"  (1.651 m) and weight is 140 lb (63.504 kg). Her oral temperature is 98.1 F (36.7 C). Her blood pressure is 125/60 and her pulse is 74. Her respiration is 16.   Well-developed and  well-nourished white female. Head and neck exam shows no scleral icterus. There is no adenopathy in the neck. She is no oral lesion. Thyroid is nonpalpable. Lungs are clear. Cardiac exam regular rate and rhythm with no murmurs rubs or bruits. Abdomen is soft. She well-healed laparotomy scar. Is no fluid wave. No palpable abdominal mass. No palpable liver or spleen tip appears to be shows some trace edema in her lower legs. Has good strength. She has good range of motion of her joints. Neurological exam is nonfocal. Skin exam shows no deficits.  Lab Results  Component Value Date   WBC 5.7 03/07/2014   HGB 11.6 03/07/2014   HCT 36.0 03/07/2014   MCV 91 03/07/2014   PLT 216 03/07/2014     Chemistry      Component Value Date/Time   NA 140 03/07/2014 1348   K 4.0 03/07/2014 1348   CL 103 03/07/2014 1348   CO2 31 03/07/2014 1348   BUN 21 03/07/2014 1348   CREATININE 0.70 03/07/2014 1348      Component Value Date/Time   CALCIUM 9.2 03/07/2014 1348   ALKPHOS 90 03/07/2014 1348   AST 28 03/07/2014 1348   ALT 28 03/07/2014 1348   BILITOT 0.5 03/07/2014 1348         Impression and Plan: Ms. Kristine Norton is a 63 year old white female. She had a stage III adenocarcinoma of the pancreas. She underwent a resection followed by adjuvant chemotherapy and radiation therapy. She now is over 14 years  from treatment. I really believe she is cured. I suppose that she could be at risk for a second primary given the radiation and chemotherapy that she had.  As always, we talked about her kids. [name] seem to be doing quite well. She will be a grandmother hopefully when we see her back.  We will see her back in 6 months time. I always get lab work before I see her. She does not mind driving over to the office for lab work.  Am glad that her performance status continues to be excellent-ECOG 0   Kristine Napoleon, MD 4/19/20153:44 PM

## 2014-03-31 ENCOUNTER — Telehealth: Payer: Self-pay | Admitting: Hematology & Oncology

## 2014-03-31 NOTE — Telephone Encounter (Signed)
Pt aware of 10-2 lab and 10-16 MD

## 2014-06-14 ENCOUNTER — Telehealth: Payer: Self-pay | Admitting: Hematology & Oncology

## 2014-06-14 NOTE — Telephone Encounter (Signed)
Faxed Medical Records via fax today to:  [facility] Attn: Jonette Pesa Ph: [phone] Fx:  [phone]   Medical  Records requested recent [place] SCANNED

## 2014-09-08 ENCOUNTER — Telehealth: Payer: Self-pay | Admitting: Hematology & Oncology

## 2014-09-08 NOTE — Telephone Encounter (Signed)
Patient called and cx 09/09/14 apt and resch for 09/23/14 same day as doctor visit

## 2014-09-09 ENCOUNTER — Other Ambulatory Visit: Payer: PRIVATE HEALTH INSURANCE | Admitting: Lab

## 2014-09-23 ENCOUNTER — Other Ambulatory Visit (HOSPITAL_BASED_OUTPATIENT_CLINIC_OR_DEPARTMENT_OTHER): Payer: BC Managed Care – PPO | Admitting: Lab

## 2014-09-23 ENCOUNTER — Encounter: Payer: Self-pay | Admitting: Hematology & Oncology

## 2014-09-23 ENCOUNTER — Ambulatory Visit (HOSPITAL_BASED_OUTPATIENT_CLINIC_OR_DEPARTMENT_OTHER): Payer: BC Managed Care – PPO | Admitting: Hematology & Oncology

## 2014-09-23 VITALS — BP 115/75 | HR 80 | Temp 98.0°F | Resp 14 | Ht 65.0 in | Wt 142.0 lb

## 2014-09-23 DIAGNOSIS — C251 Malignant neoplasm of body of pancreas: Secondary | ICD-10-CM

## 2014-09-23 DIAGNOSIS — C259 Malignant neoplasm of pancreas, unspecified: Secondary | ICD-10-CM

## 2014-09-23 DIAGNOSIS — C241 Malignant neoplasm of ampulla of Vater: Secondary | ICD-10-CM

## 2014-09-23 LAB — CBC WITH DIFFERENTIAL (CANCER CENTER ONLY)
BASO#: 0 10*3/uL (ref 0.0–0.2)
BASO%: 0.1 % (ref 0.0–2.0)
EOS%: 1.7 % (ref 0.0–7.0)
Eosinophils Absolute: 0.1 10*3/uL (ref 0.0–0.5)
HCT: 36.7 % (ref 34.8–46.6)
HGB: 11.8 g/dL (ref 11.6–15.9)
LYMPH#: 1.4 10*3/uL (ref 0.9–3.3)
LYMPH%: 20.2 % (ref 14.0–48.0)
MCH: 29.5 pg (ref 26.0–34.0)
MCHC: 32.2 g/dL (ref 32.0–36.0)
MCV: 92 fL (ref 81–101)
MONO#: 0.5 10*3/uL (ref 0.1–0.9)
MONO%: 7.5 % (ref 0.0–13.0)
NEUT%: 70.5 % (ref 39.6–80.0)
NEUTROS ABS: 4.9 10*3/uL (ref 1.5–6.5)
Platelets: 212 10*3/uL (ref 145–400)
RBC: 4 10*6/uL (ref 3.70–5.32)
RDW: 13.9 % (ref 11.1–15.7)
WBC: 7 10*3/uL (ref 3.9–10.0)

## 2014-09-23 LAB — COMPREHENSIVE METABOLIC PANEL
ALK PHOS: 95 U/L (ref 39–117)
ALT: 29 U/L (ref 0–35)
AST: 30 U/L (ref 0–37)
Albumin: 3.7 g/dL (ref 3.5–5.2)
BUN: 19 mg/dL (ref 6–23)
CO2: 31 mEq/L (ref 19–32)
Calcium: 9.5 mg/dL (ref 8.4–10.5)
Chloride: 103 mEq/L (ref 96–112)
Creatinine, Ser: 0.8 mg/dL (ref 0.50–1.10)
Glucose, Bld: 78 mg/dL (ref 70–99)
POTASSIUM: 4.3 meq/L (ref 3.5–5.3)
Sodium: 142 mEq/L (ref 135–145)
Total Bilirubin: 0.5 mg/dL (ref 0.2–1.2)
Total Protein: 6.2 g/dL (ref 6.0–8.3)

## 2014-09-23 LAB — CANCER ANTIGEN 19-9: CA 19-9: 7.8 U/mL (ref <35.0)

## 2014-09-23 LAB — LACTATE DEHYDROGENASE: LDH: 183 U/L (ref 94–250)

## 2014-09-25 DIAGNOSIS — C259 Malignant neoplasm of pancreas, unspecified: Secondary | ICD-10-CM | POA: Insufficient documentation

## 2014-09-25 NOTE — Progress Notes (Signed)
Hematology and Oncology Follow Up Visit  JEANENNE LICEA [id number] Mar 26, 1951 63 y.o. 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   Current Therapy:    Observation     Interim History:  Ms.  Daoud is back for her six-month followup appears to be doing okay. 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 The big news is that her daughter is now pregnant. She is due in February.  When Ms. Dower was diagnosed with pancreatic cancer about 15 years ago, I told her that "she would live to see her children's children." This is from scripture. This is now come true.   She's had no new health issues. She has no bowel pain. She is eating well. She's having no nausea  or vomiting. She's had no fever sweats or chills. There's been no problems with cough. She's had no leg swelling. There's been no rashes. Patient didn't get her mammograms routinely.  We check her CEA 19-9. Last level was 12.7.  Medications: Current outpatient prescriptions:calcium carbonate (OS-CAL) 600 MG TABS, Take 600 mg by mouth daily., Disp: , Rfl: ;  cholecalciferol 2000 UNITS tablet, Take 1 tablet (2,000 Units total) by mouth daily., Disp: 30 tablet, Rfl: ;  Multiple Vitamins-Minerals (MULTIVITAMIN WITH MINERALS) tablet, Take 1 tablet by mouth daily., Disp: , Rfl:  omeprazole-sodium bicarbonate (ZEGERID) 40-1100 MG per capsule, Take 1 capsule by mouth daily before breakfast. , Disp: , Rfl: ;  Probiotic Product (ALIGN PO), Take 1 capsule by mouth., Disp: , Rfl: ;  temazepam (RESTORIL) 15 MG capsule, Take 30 mg by mouth at bedtime as needed. , Disp: , Rfl:   Allergies:  Allergies  Allergen Reactions  . Adhesive [Tape]     Rash    Past Medical History, Surgical history, Social history, and Family History were reviewed and updated.  Review of Systems: As above  Physical Exam:  height is 5\' 5"  (1.651 m) and weight is 142  lb (64.411 kg). Her oral temperature is 98 F (36.7 C). Her blood pressure is 115/75 and her pulse is 80. Her respiration is 14.   Well-developed and well-nourished white female. Head and neck exam shows no scleral icterus. There is no adenopathy in the neck. She is no oral lesion. Thyroid is nonpalpable. Lungs are clear. Cardiac exam regular rate and rhythm with no murmurs rubs or bruits. Abdomen is soft. She has a  well-healed laparotomy scar.  there isno fluid wave. No palpable abdominal mass. No palpable liver or spleen tip. Extremities  shows some trace edema in her lower legs. this is chronic. She has  good strengthIn her extremities . She has good range of motion of her joints. Neurological exam is nonfocal. Skin exam shows no rashes, ecchymoses or petechia or erythema   Lab Results  Component Value Date   WBC 7.0 09/23/2014   HGB 11.8 09/23/2014   HCT 36.7 09/23/2014   MCV 92 09/23/2014   PLT 212 09/23/2014     Chemistry      Component Value Date/Time   NA 142 09/23/2014 1133   K 4.3 09/23/2014 1133   CL 103 09/23/2014 1133   CO2 31 09/23/2014 1133   BUN 19 09/23/2014 1133   CREATININE 0.80 09/23/2014 1133      Component Value Date/Time   CALCIUM 9.5 09/23/2014 1133   ALKPHOS 95 09/23/2014 1133   AST 30 09/23/2014 1133   ALT  29 09/23/2014 1133   BILITOT 0.5 09/23/2014 1133         Impression and Plan: Ms. Connon is a 63 year old white female. She had a stage III adenocarcinoma of the pancreas. She underwent a resection followed by adjuvant chemotherapy and radiation therapy. She now is over  15 years from treatment. I really believe she is cured. I suppose that she could be at risk for a second primary given the radiation and chemotherapy that she had.   I am just so happy for her because of a grandchild. She does not know if it is a boy or girl. She thinks it will be a boy.   Her CA 19-9 is 7.8 today.   We will see her back in 6 months time. I always get lab work  before I see her. She does not mind driving over to the office for lab work.  Am glad that her performance status continues to be excellent-ECOG 0   Volanda Napoleon, MD 10/18/20151:36 PM

## 2014-09-26 ENCOUNTER — Telehealth: Payer: Self-pay | Admitting: *Deleted

## 2014-09-26 ENCOUNTER — Telehealth: Payer: Self-pay | Admitting: Hematology & Oncology

## 2014-09-26 NOTE — Telephone Encounter (Signed)
Mailed 03-2015 schedule °

## 2014-09-26 NOTE — Telephone Encounter (Addendum)
Message copied by Lenn Sink on Mon Sep 26, 2014  2:08 PM ------      Message from: Volanda Napoleon      Created: Fri Sep 23, 2014  6:39 PM       Call and tell her that the CA 19-9 is only 7.8.Laurey Arrow. Congratulate her on her first grandchild that will be worn next year!!! ------Informed pt that CA 19-9 is only 7.8. Pt verbalized understanding.

## 2014-12-22 ENCOUNTER — Telehealth: Payer: Self-pay | Admitting: Hematology & Oncology

## 2014-12-22 NOTE — Telephone Encounter (Signed)
Pt left message wanting to move April to may. I left her message with 5-13 appointment

## 2015-03-24 ENCOUNTER — Ambulatory Visit: Payer: BC Managed Care – PPO | Admitting: Hematology & Oncology

## 2015-03-24 ENCOUNTER — Other Ambulatory Visit: Payer: BC Managed Care – PPO | Admitting: Lab

## 2015-04-21 ENCOUNTER — Other Ambulatory Visit (HOSPITAL_BASED_OUTPATIENT_CLINIC_OR_DEPARTMENT_OTHER): Payer: 59

## 2015-04-21 ENCOUNTER — Encounter: Payer: Self-pay | Admitting: Hematology & Oncology

## 2015-04-21 ENCOUNTER — Ambulatory Visit (HOSPITAL_BASED_OUTPATIENT_CLINIC_OR_DEPARTMENT_OTHER): Payer: 59 | Admitting: Hematology & Oncology

## 2015-04-21 VITALS — BP 109/65 | HR 72 | Temp 97.5°F | Resp 14 | Ht 65.0 in | Wt 140.0 lb

## 2015-04-21 DIAGNOSIS — Z8507 Personal history of malignant neoplasm of pancreas: Secondary | ICD-10-CM

## 2015-04-21 DIAGNOSIS — C253 Malignant neoplasm of pancreatic duct: Secondary | ICD-10-CM

## 2015-04-21 DIAGNOSIS — C259 Malignant neoplasm of pancreas, unspecified: Secondary | ICD-10-CM

## 2015-04-21 LAB — COMPREHENSIVE METABOLIC PANEL
ALBUMIN: 3.8 g/dL (ref 3.5–5.2)
ALK PHOS: 102 U/L (ref 39–117)
ALT: 29 U/L (ref 0–35)
AST: 27 U/L (ref 0–37)
BUN: 25 mg/dL — ABNORMAL HIGH (ref 6–23)
CO2: 30 meq/L (ref 19–32)
Calcium: 9.4 mg/dL (ref 8.4–10.5)
Chloride: 102 mEq/L (ref 96–112)
Creatinine, Ser: 0.71 mg/dL (ref 0.50–1.10)
Glucose, Bld: 84 mg/dL (ref 70–99)
Potassium: 4.3 mEq/L (ref 3.5–5.3)
SODIUM: 141 meq/L (ref 135–145)
TOTAL PROTEIN: 6.6 g/dL (ref 6.0–8.3)
Total Bilirubin: 0.5 mg/dL (ref 0.2–1.2)

## 2015-04-21 LAB — CBC WITH DIFFERENTIAL (CANCER CENTER ONLY)
BASO#: 0 10*3/uL (ref 0.0–0.2)
BASO%: 0.3 % (ref 0.0–2.0)
EOS ABS: 0.1 10*3/uL (ref 0.0–0.5)
EOS%: 2 % (ref 0.0–7.0)
HCT: 36.6 % (ref 34.8–46.6)
HEMOGLOBIN: 11.9 g/dL (ref 11.6–15.9)
LYMPH#: 1.5 10*3/uL (ref 0.9–3.3)
LYMPH%: 22.8 % (ref 14.0–48.0)
MCH: 29.7 pg (ref 26.0–34.0)
MCHC: 32.5 g/dL (ref 32.0–36.0)
MCV: 91 fL (ref 81–101)
MONO#: 0.5 10*3/uL (ref 0.1–0.9)
MONO%: 8.1 % (ref 0.0–13.0)
NEUT#: 4.3 10*3/uL (ref 1.5–6.5)
NEUT%: 66.8 % (ref 39.6–80.0)
PLATELETS: 224 10*3/uL (ref 145–400)
RBC: 4.01 10*6/uL (ref 3.70–5.32)
RDW: 14.1 % (ref 11.1–15.7)
WBC: 6.5 10*3/uL (ref 3.9–10.0)

## 2015-04-21 LAB — CANCER ANTIGEN 19-9: CA 19 9: 12.8 U/mL (ref <35.0)

## 2015-04-24 ENCOUNTER — Telehealth: Payer: Self-pay | Admitting: *Deleted

## 2015-04-24 NOTE — Progress Notes (Signed)
Hematology and Oncology Follow Up Visit  Kristine Norton [id number] 07-01-1951 64 y.o. 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   Current Therapy:    Observation     Interim History:  Ms.  Norton is back for Kristine six-month followup appears to be doing okay. 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   Kristine Norton had and Kristine baby in February. She was very healthy.  The bad news now is that Kristine Norton and Kristine husband will be moving to [place]. I've known Kristine for over 15 years. She is cured from my point of view. There is nothing appear to keep Kristine living appear. She and Kristine husband want to be close to their first grandchild.  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 She does a lot of driving. She sees quite a few patients.  She's had no abdominal pain. Kristine appetite has been good. She's had no nausea or vomiting. There's been no change in bowel or bladder habits. She's had no cough or shortness of breath. There's been no leg swelling.  Kristine CA 19-9 is 12.8. This is holding steady.  Overall, Kristine performance status is ECOG 0..    Medications:  Current outpatient prescriptions:  .  calcium carbonate (OS-CAL) 600 MG TABS, Take 600 mg by mouth daily., Disp: , Rfl:  .  cholecalciferol 2000 UNITS tablet, Take 1 tablet (2,000 Units total) by mouth daily., Disp: 30 tablet, Rfl:  .  Multiple Vitamins-Minerals (MULTIVITAMIN WITH MINERALS) tablet, Take 1 tablet by mouth daily., Disp: , Rfl:  .  omeprazole-sodium bicarbonate (ZEGERID) 40-1100 MG per capsule, Take 1 capsule by mouth daily before breakfast. , Disp: , Rfl:  .  Probiotic Product (ALIGN PO), Take 1 capsule by mouth., Disp: , Rfl:  .  temazepam (RESTORIL) 15 MG capsule, Take 30 mg by mouth at bedtime as needed. , Disp: , Rfl:   Allergies:  Allergies  Allergen Reactions  .  Adhesive [Tape]     Rash    Past Medical History, Surgical history, Social history, and Family History were reviewed and updated.  Review of Systems: As above  Physical Exam:  height is 5\' 5"  (1.651 m) and weight is 140 lb (63.504 kg). Kristine oral temperature is 97.5 F (36.4 C). Kristine blood pressure is 109/65 and Kristine pulse is 72. Kristine respiration is 14.   Well-developed and well-nourished white female. Head and neck exam shows no scleral icterus. There is no adenopathy in the neck. She is no oral lesion. Thyroid is nonpalpable. Lungs are clear. Cardiac exam regular rate and rhythm with no murmurs rubs or bruits. Abdomen is soft. She has a  well-healed laparotomy scar.  there isno fluid wave. No palpable abdominal mass. No palpable liver or spleen tip. Extremities  shows some trace edema in Kristine lower legs. this is chronic. She has  good strengthIn Kristine extremities . She has good range of motion of Kristine joints. Neurological exam is nonfocal. Skin exam shows no rashes, ecchymoses or petechia or erythema   Lab Results  Component Value Date   WBC 6.5 04/21/2015   HGB 11.9 04/21/2015   HCT 36.6 04/21/2015   MCV 91 04/21/2015   PLT 224 04/21/2015     Chemistry      Component Value Date/Time   NA 141 04/21/2015 1145  K 4.3 04/21/2015 1145   CL 102 04/21/2015 1145   CO2 30 04/21/2015 1145   BUN 25* 04/21/2015 1145   CREATININE 0.71 04/21/2015 1145      Component Value Date/Time   CALCIUM 9.4 04/21/2015 1145   ALKPHOS 102 04/21/2015 1145   AST 27 04/21/2015 1145   ALT 29 04/21/2015 1145   BILITOT 0.5 04/21/2015 1145         Impression and Plan: Kristine Norton is a 64 year old white female. She had a stage III adenocarcinoma of the pancreas. She underwent a resection followed by adjuvant chemotherapy and radiation therapy. She now is over  15 years from treatment. I really believe she is cured. I suppose that she could be at risk for a second primary given the radiation and chemotherapy  that she had.   This is a very sad day for [place]. This is the last time we will see Kristine. She will will be moving to [place]. Kristine Norton and grandchild are down there. Kristine husband is retired.  She has done so well. She has had such a strong faith.  We will go ahead and say goodbye. I know that she will do well. I don't even think she needs to see an oncologist down there. I think as long she gets a good primary care doctor, that is the best for Kristine.   Volanda Napoleon, MD 5/16/20166:37 AM

## 2015-04-24 NOTE — Telephone Encounter (Signed)
-----   Message from Kristine Napoleon, MD sent at 04/21/2015  6:33 PM EDT ----- Call - labs and cancer marker are ok!!!  Please send her a copy of her labs as she is moving to [place].  We will miss you!!!!   Laurey Arrow

## 2015-12-01 ENCOUNTER — Telehealth: Payer: Self-pay | Admitting: *Deleted

## 2015-12-01 NOTE — Telephone Encounter (Signed)
Medical Records sent to  [facility] Fax [phone]  Medical Records Release obtain via mail and confimed by two RN's. Document placed in scan bin.
